# Patient Record
Sex: Male | Born: 1965 | Race: White | Hispanic: No | Marital: Married | Smoking: Current every day smoker
Health system: Southern US, Community
[De-identification: ages and names within clinical notes are randomized; demographics above are authoritative.]

## PROBLEM LIST (undated history)

## (undated) DIAGNOSIS — F250 Schizoaffective disorder, bipolar type: Secondary | ICD-10-CM

## (undated) DIAGNOSIS — I10 Essential (primary) hypertension: Secondary | ICD-10-CM

## (undated) DIAGNOSIS — E7800 Pure hypercholesterolemia, unspecified: Secondary | ICD-10-CM

## (undated) DIAGNOSIS — F259 Schizoaffective disorder, unspecified: Secondary | ICD-10-CM

---

## 2011-10-29 ENCOUNTER — Emergency Department (HOSPITAL_COMMUNITY)
Admission: EM | Admit: 2011-10-29 | Discharge: 2011-10-29 | Disposition: A | Discharge status: Home / Self Care | Payer: Medicare Other | Attending: Emergency Medicine | Admitting: Emergency Medicine

## 2011-10-29 ENCOUNTER — Emergency Department (HOSPITAL_COMMUNITY): Payer: Medicare Other

## 2011-10-29 ENCOUNTER — Other Ambulatory Visit: Payer: Self-pay

## 2011-10-29 ENCOUNTER — Encounter (HOSPITAL_COMMUNITY): Payer: Self-pay

## 2011-10-29 DIAGNOSIS — Z79899 Other long term (current) drug therapy: Secondary | ICD-10-CM | POA: Insufficient documentation

## 2011-10-29 DIAGNOSIS — E7800 Pure hypercholesterolemia, unspecified: Secondary | ICD-10-CM | POA: Insufficient documentation

## 2011-10-29 DIAGNOSIS — R5383 Other fatigue: Secondary | ICD-10-CM | POA: Insufficient documentation

## 2011-10-29 DIAGNOSIS — R5381 Other malaise: Secondary | ICD-10-CM | POA: Insufficient documentation

## 2011-10-29 DIAGNOSIS — Z8659 Personal history of other mental and behavioral disorders: Secondary | ICD-10-CM | POA: Insufficient documentation

## 2011-10-29 DIAGNOSIS — R531 Weakness: Secondary | ICD-10-CM

## 2011-10-29 DIAGNOSIS — I10 Essential (primary) hypertension: Secondary | ICD-10-CM | POA: Insufficient documentation

## 2011-10-29 HISTORY — DX: Schizoaffective disorder, bipolar type: F25.0

## 2011-10-29 HISTORY — DX: Essential (primary) hypertension: I10

## 2011-10-29 HISTORY — DX: Schizoaffective disorder, unspecified: F25.9

## 2011-10-29 HISTORY — DX: Pure hypercholesterolemia, unspecified: E78.00

## 2011-10-29 LAB — DIFFERENTIAL
Basophils Absolute: 0.1 10*3/uL (ref 0.0–0.1)
Eosinophils Relative: 3 % (ref 0–5)
Lymphocytes Relative: 24 % (ref 12–46)
Monocytes Absolute: 1.4 10*3/uL — ABNORMAL HIGH (ref 0.1–1.0)
Monocytes Relative: 10 % (ref 3–12)
Neutro Abs: 9.2 10*3/uL — ABNORMAL HIGH (ref 1.7–7.7)

## 2011-10-29 LAB — COMPREHENSIVE METABOLIC PANEL
BUN: 7 mg/dL (ref 6–23)
CO2: 26 mEq/L (ref 19–32)
Calcium: 9.6 mg/dL (ref 8.4–10.5)
Chloride: 100 mEq/L (ref 96–112)
Creatinine, Ser: 0.71 mg/dL (ref 0.50–1.35)
GFR calc Af Amer: >90 mL/min (ref >90)
GFR calc non Af Amer: >90 mL/min (ref >90)
Total Bilirubin: 0.2 mg/dL — ABNORMAL LOW (ref 0.3–1.2)

## 2011-10-29 LAB — CBC
HCT: 50.1 % (ref 39.0–52.0)
Hemoglobin: 17.5 g/dL — ABNORMAL HIGH (ref 13.0–17.0)
MCHC: 34.9 g/dL (ref 30.0–36.0)
MCV: 99.8 fL (ref 78.0–100.0)
RDW: 14.6 % (ref 11.5–15.5)
WBC: 14.5 10*3/uL — ABNORMAL HIGH (ref 4.0–10.5)

## 2011-10-29 LAB — TSH: TSH: 1.035 u[IU]/mL (ref 0.350–4.500)

## 2011-10-29 NOTE — ED Provider Notes (Signed)
History     CSN: [id number]  Arrival date & time 10/29/11  1428   First MD Initiated Contact with Patient 10/29/11 1536      Chief Complaint  Patient presents with  . Fatigue    (Consider location/radiation/quality/duration/timing/severity/associated sxs/prior treatment) Patient is a 46 y.o. male presenting with weakness. The history is provided by the patient.  Weakness Primary symptoms do not include fever. Primary symptoms comment: generalized weakness, fatigue Episode onset: four months ago. The symptoms are unchanged. The neurological symptoms are diffuse. Context: nothing.  Additional symptoms include weakness.    Past Medical History  Diagnosis Date  . High cholesterol   . Hypertension   . Schizophrenia, schizo-affective     History reviewed. No pertinent past surgical history.  History reviewed. No pertinent family history.  History  Substance Use Topics  . Smoking status: Current Everyday Smoker  . Smokeless tobacco: Not on file  . Alcohol Use: No      Review of Systems  Constitutional: Positive for activity change and fatigue. Negative for fever, chills, appetite change and unexpected weight change.  Neurological: Positive for weakness.  All other systems reviewed and are negative.    Allergies  Review of patient's allergies indicates no known allergies.  Home Medications   Current Outpatient Rx  Name Route Sig Dispense Refill  . MAGNESIUM 250 MG PO TABS Oral Take 1 tablet by mouth daily.    . OMEGA-3-ACID ETHYL ESTERS 1 G PO CAPS Oral Take 4 g by mouth daily.    Marland Kitchen ROSUVASTATIN CALCIUM 10 MG PO TABS Oral Take 10 mg by mouth at bedtime.    . TESTOSTERONE CYPIONATE 100 MG/ML IM OIL Intramuscular Inject 200 mg into the muscle every 28 (twenty-eight) days. For IM use only    . TRIHEXYPHENIDYL HCL 2 MG PO TABS Oral Take 2 mg by mouth at bedtime.    Marland Kitchen VITAMIN D (ERGOCALCIFEROL) 50000 UNITS PO CAPS Oral Take 50,000 Units by mouth every 7 (seven) days.  monday      BP 171/98  Pulse 106  Temp(Src) 98 F (36.7 C) (Oral)  Resp 20  SpO2 96%  Physical Exam  Nursing note and vitals reviewed. Constitutional: He is oriented to person, place, and time. He appears well-developed and well-nourished. No distress.  HENT:  Head: Normocephalic and atraumatic.  Eyes: EOM are normal. Pupils are equal, round, and reactive to light.  Neck: Normal range of motion. Neck supple.  Cardiovascular: Normal rate and regular rhythm.   No murmur heard. Pulmonary/Chest: Effort normal and breath sounds normal. No respiratory distress. He has no wheezes.  Abdominal: Soft. Bowel sounds are normal. He exhibits no distension. There is no tenderness.  Musculoskeletal: Normal range of motion. He exhibits no edema.  Lymphadenopathy:    He has no cervical adenopathy.  Neurological: He is alert and oriented to person, place, and time. No cranial nerve deficit.  Skin: Skin is warm and dry. He is not diaphoretic.    ED Course  Procedures (including critical care time)  Labs Reviewed  CBC - Abnormal; Notable for the following:    WBC 14.5 (*)    Hemoglobin 17.5 (*)    MCH 34.9 (*)    All other components within normal limits  DIFFERENTIAL - Abnormal; Notable for the following:    Neutro Abs 9.2 (*)    Monocytes Absolute 1.4 (*)    All other components within normal limits  COMPREHENSIVE METABOLIC PANEL - Abnormal; Notable for the following:  Total Bilirubin 0.2 (*)    All other components within normal limits  TSH   Dg Chest 2 View  10/29/2011  *RADIOLOGY REPORT*  Clinical Data: Fatigue,  4 months  CHEST - 2 VIEW  Comparison: None.  Findings: Normal mediastinum and heart silhouette.  Costophrenic angles are clear.  No effusion, infiltrate, pneumothorax.  IMPRESSION: No acute cardiopulmonary process.  Original Report Authenticated By: Genevive Bi, M.D.     No diagnosis found.   Date: 10/29/2011  Rate: 88  Rhythm: normal sinus rhythm  QRS Axis:  normal  Intervals: normal  ST/T Wave abnormalities: normal  Conduction Disutrbances:none  Narrative Interpretation:   Old EKG Reviewed: none available    MDM  The labs and ekg all look okay as does the chest xray.  The tsh is still pending.  I am unsure what is causing his chronic fatigue, but it does not appear emergent.  He will be discharged to home.  To follow up with pcp for further workup if symptoms do not resolve.        Geoffery Lyons, MD 10/29/11 1754

## 2011-10-29 NOTE — ED Notes (Signed)
Pt complains of feeling tired now for 4 months.

## 2011-10-29 NOTE — Discharge Instructions (Signed)

## 2014-02-17 ENCOUNTER — Emergency Department (INDEPENDENT_AMBULATORY_CARE_PROVIDER_SITE_OTHER)
Admission: EM | Admit: 2014-02-17 | Discharge: 2014-02-17 | Disposition: A | Discharge status: Home / Self Care | Payer: Medicare Other | Source: Home / Self Care

## 2014-02-17 ENCOUNTER — Encounter (HOSPITAL_COMMUNITY): Payer: Self-pay | Admitting: Emergency Medicine

## 2014-02-17 DIAGNOSIS — J440 Chronic obstructive pulmonary disease with acute lower respiratory infection: Secondary | ICD-10-CM

## 2014-02-17 DIAGNOSIS — J209 Acute bronchitis, unspecified: Secondary | ICD-10-CM

## 2014-02-17 DIAGNOSIS — Z23 Encounter for immunization: Secondary | ICD-10-CM

## 2014-02-17 MED ORDER — MINOCYCLINE HCL 100 MG PO CAPS: 100.0000 mg | ORAL_CAPSULE | Freq: Two times a day (BID) | ORAL | Status: DC

## 2014-02-17 MED ORDER — GUAIFENESIN-CODEINE 100-10 MG/5ML PO SYRP: 10.0000 mL | ORAL_SOLUTION | Freq: Four times a day (QID) | ORAL | Status: DC | PRN

## 2014-02-17 MED ORDER — TETANUS-DIPHTH-ACELL PERTUSSIS 5-2.5-18.5 LF-MCG/0.5 IM SUSP
0.5000 mL | Freq: Once | INTRAMUSCULAR | Status: AC
  Administered 2014-02-17: 0.5 mL via INTRAMUSCULAR

## 2014-02-17 MED ORDER — TETANUS-DIPHTH-ACELL PERTUSSIS 5-2.5-18.5 LF-MCG/0.5 IM SUSP
INTRAMUSCULAR | Status: AC
  Filled 2014-02-17: qty 0.5

## 2014-02-17 NOTE — ED Notes (Signed)
Patient requested tdap

## 2014-02-17 NOTE — ED Provider Notes (Signed)
CSN: [id number]     Arrival date & time 02/17/14  1738 History   None    No chief complaint on file.  (Consider location/radiation/quality/duration/timing/severity/associated sxs/prior Treatment) Patient is a 48 y.o. male presenting with cough.  Cough Cough characteristics:  Non-productive, dry and hacking Severity:  Moderate Onset quality:  Gradual Duration:  1 week Progression:  Unchanged Chronicity:  New Smoker: yes   Context: sick contacts   Relieved by:  None tried Worsened by:  Nothing tried Associated symptoms: rhinorrhea   Associated symptoms: no fever, no shortness of breath, no sore throat and no wheezing     Past Medical History  Diagnosis Date  . High cholesterol   . Hypertension   . Schizophrenia, schizo-affective    No past surgical history on file. No family history on file. History  Substance Use Topics  . Smoking status: Current Every Day Smoker  . Smokeless tobacco: Not on file  . Alcohol Use: No    Review of Systems  Constitutional: Negative.  Negative for fever.  HENT: Positive for congestion and rhinorrhea. Negative for sore throat.   Respiratory: Positive for cough. Negative for shortness of breath and wheezing.   Cardiovascular: Negative.   Gastrointestinal: Negative.     Allergies  Review of patient's allergies indicates no known allergies.  Home Medications   Prior to Admission medications   Medication Sig Start Date End Date Taking? Authorizing Provider  guaiFENesin-codeine (ROBITUSSIN AC) 100-10 MG/5ML syrup Take 10 mLs by mouth 4 (four) times daily as needed for cough. 02/17/14   Linna HoffJames D Rashid Whitenight, MD  Magnesium 250 MG TABS Take 1 tablet by mouth daily.    Historical Provider, MD  minocycline (MINOCIN,DYNACIN) 100 MG capsule Take 1 capsule (100 mg total) by mouth 2 (two) times daily. 02/17/14   Linna HoffJames D Yomar Mejorado, MD  omega-3 acid ethyl esters (LOVAZA) 1 G capsule Take 4 g by mouth daily.    Historical Provider, MD  rosuvastatin (CRESTOR) 10 MG  tablet Take 10 mg by mouth at bedtime.    Historical Provider, MD  testosterone cypionate (DEPOTESTOTERONE CYPIONATE) 100 MG/ML injection Inject 200 mg into the muscle every 28 (twenty-eight) days. For IM use only    Historical Provider, MD  trihexyphenidyl (ARTANE) 2 MG tablet Take 2 mg by mouth at bedtime.    Historical Provider, MD  Vitamin D, Ergocalciferol, (DRISDOL) 50000 UNITS CAPS Take 50,000 Units by mouth every 7 (seven) days. monday    Historical Provider, MD   BP 148/77  Pulse 90  Temp(Src) 98 F (36.7 C) (Oral)  SpO2 97% Physical Exam  Nursing note and vitals reviewed. Constitutional: He is oriented to person, place, and time. He appears well-developed and well-nourished. No distress.  HENT:  Right Ear: External ear normal.  Left Ear: External ear normal.  Mouth/Throat: Oropharynx is clear and moist.  Eyes: Pupils are equal, round, and reactive to light.  Neck: Normal range of motion. Neck supple.  Cardiovascular: Normal rate and normal heart sounds.   Pulmonary/Chest: Effort normal. No respiratory distress. He has no wheezes. He has rhonchi.  Abdominal: Soft. Bowel sounds are normal.  Neurological: He is alert and oriented to person, place, and time.  Skin: Skin is warm and dry.    ED Course  Procedures (including critical care time) Labs Review Labs Reviewed - No data to display  Imaging Review No results found.   MDM   1. Acute bronchitis with COPD       Linna HoffJames D Kim Lauver, MD  02/17/14 1841 

## 2014-02-17 NOTE — ED Notes (Signed)
Generalized achiness, sluggish, cough. Onset one week ago.  Other family members have been sick with similar symptoms

## 2014-02-17 NOTE — Discharge Instructions (Signed)
Take all of medicine, drink lots of fluids, no more smoking, see your doctor if further problems  °

## 2014-03-09 ENCOUNTER — Inpatient Hospital Stay (HOSPITAL_COMMUNITY)
Admission: EM | Admit: 2014-03-09 | Discharge: 2014-03-12 | DRG: 392 | Disposition: A | Discharge status: Home / Self Care | Payer: Medicare Other | Attending: Internal Medicine | Admitting: Internal Medicine

## 2014-03-09 ENCOUNTER — Emergency Department (HOSPITAL_COMMUNITY): Payer: Medicare Other

## 2014-03-09 ENCOUNTER — Encounter (HOSPITAL_COMMUNITY): Payer: Self-pay | Admitting: Emergency Medicine

## 2014-03-09 DIAGNOSIS — E876 Hypokalemia: Secondary | ICD-10-CM | POA: Diagnosis present

## 2014-03-09 DIAGNOSIS — Z720 Tobacco use: Secondary | ICD-10-CM | POA: Diagnosis present

## 2014-03-09 DIAGNOSIS — E7800 Pure hypercholesterolemia, unspecified: Secondary | ICD-10-CM | POA: Diagnosis present

## 2014-03-09 DIAGNOSIS — F17200 Nicotine dependence, unspecified, uncomplicated: Secondary | ICD-10-CM | POA: Diagnosis present

## 2014-03-09 DIAGNOSIS — D751 Secondary polycythemia: Secondary | ICD-10-CM | POA: Diagnosis present

## 2014-03-09 DIAGNOSIS — E860 Dehydration: Secondary | ICD-10-CM | POA: Diagnosis present

## 2014-03-09 DIAGNOSIS — K589 Irritable bowel syndrome without diarrhea: Secondary | ICD-10-CM | POA: Diagnosis present

## 2014-03-09 DIAGNOSIS — I517 Cardiomegaly: Secondary | ICD-10-CM | POA: Diagnosis present

## 2014-03-09 DIAGNOSIS — J449 Chronic obstructive pulmonary disease, unspecified: Secondary | ICD-10-CM | POA: Diagnosis present

## 2014-03-09 DIAGNOSIS — F259 Schizoaffective disorder, unspecified: Secondary | ICD-10-CM | POA: Diagnosis present

## 2014-03-09 DIAGNOSIS — R5381 Other malaise: Secondary | ICD-10-CM | POA: Diagnosis present

## 2014-03-09 DIAGNOSIS — K224 Dyskinesia of esophagus: Secondary | ICD-10-CM | POA: Diagnosis present

## 2014-03-09 DIAGNOSIS — E785 Hyperlipidemia, unspecified: Secondary | ICD-10-CM | POA: Diagnosis present

## 2014-03-09 DIAGNOSIS — Z91199 Patient's noncompliance with other medical treatment and regimen due to unspecified reason: Secondary | ICD-10-CM

## 2014-03-09 DIAGNOSIS — I10 Essential (primary) hypertension: Secondary | ICD-10-CM | POA: Diagnosis present

## 2014-03-09 DIAGNOSIS — Z9119 Patient's noncompliance with other medical treatment and regimen: Secondary | ICD-10-CM

## 2014-03-09 DIAGNOSIS — D7589 Other specified diseases of blood and blood-forming organs: Secondary | ICD-10-CM | POA: Diagnosis present

## 2014-03-09 DIAGNOSIS — R079 Chest pain, unspecified: Secondary | ICD-10-CM | POA: Diagnosis not present

## 2014-03-09 DIAGNOSIS — I2510 Atherosclerotic heart disease of native coronary artery without angina pectoris: Secondary | ICD-10-CM | POA: Diagnosis present

## 2014-03-09 DIAGNOSIS — K5900 Constipation, unspecified: Secondary | ICD-10-CM | POA: Diagnosis present

## 2014-03-09 DIAGNOSIS — D696 Thrombocytopenia, unspecified: Secondary | ICD-10-CM | POA: Diagnosis present

## 2014-03-09 DIAGNOSIS — R5383 Other fatigue: Secondary | ICD-10-CM

## 2014-03-09 DIAGNOSIS — E781 Pure hyperglyceridemia: Secondary | ICD-10-CM | POA: Diagnosis present

## 2014-03-09 DIAGNOSIS — J4489 Other specified chronic obstructive pulmonary disease: Secondary | ICD-10-CM | POA: Diagnosis present

## 2014-03-09 DIAGNOSIS — K219 Gastro-esophageal reflux disease without esophagitis: Principal | ICD-10-CM | POA: Diagnosis present

## 2014-03-09 DIAGNOSIS — D72829 Elevated white blood cell count, unspecified: Secondary | ICD-10-CM | POA: Diagnosis present

## 2014-03-09 DIAGNOSIS — R1310 Dysphagia, unspecified: Secondary | ICD-10-CM | POA: Diagnosis present

## 2014-03-09 LAB — CBC
HEMATOCRIT: 54.9 % — AB (ref 39.0–52.0)
HEMOGLOBIN: 19.9 g/dL — AB (ref 13.0–17.0)
MCH: 38.3 pg — AB (ref 26.0–34.0)
MCHC: 36.2 g/dL — AB (ref 30.0–36.0)
MCV: 105.6 fL — AB (ref 78.0–100.0)
Platelets: 166 10*3/uL (ref 150–400)
RBC: 5.2 MIL/uL (ref 4.22–5.81)
RDW: 14.5 % (ref 11.5–15.5)
WBC: 11.8 10*3/uL — ABNORMAL HIGH (ref 4.0–10.5)

## 2014-03-09 LAB — I-STAT TROPONIN, ED: Troponin i, poc: 0 ng/mL (ref 0.00–0.08)

## 2014-03-09 LAB — BASIC METABOLIC PANEL
Anion gap: 18 — ABNORMAL HIGH (ref 5–15)
BUN: 12 mg/dL (ref 6–23)
CALCIUM: 10 mg/dL (ref 8.4–10.5)
CO2: 25 mEq/L (ref 19–32)
Chloride: 95 mEq/L — ABNORMAL LOW (ref 96–112)
Creatinine, Ser: 0.79 mg/dL (ref 0.50–1.35)
GFR calc Af Amer: >90 mL/min (ref >90)
GLUCOSE: 122 mg/dL — AB (ref 70–99)
Potassium: 3.5 mEq/L — ABNORMAL LOW (ref 3.7–5.3)
Sodium: 138 mEq/L (ref 137–147)

## 2014-03-09 LAB — TROPONIN I: Troponin I: <0.3 ng/mL (ref <0.30)

## 2014-03-09 LAB — PRO B NATRIURETIC PEPTIDE: Pro B Natriuretic peptide (BNP): 61.1 pg/mL (ref 0–125)

## 2014-03-09 MED ORDER — ONDANSETRON HCL 4 MG PO TABS: 4.0000 mg | ORAL_TABLET | Freq: Four times a day (QID) | ORAL | Status: DC | PRN

## 2014-03-09 MED ORDER — CHLORTHALIDONE 25 MG PO TABS
25.0000 mg | ORAL_TABLET | Freq: Every day | ORAL | Status: DC
  Administered 2014-03-10 – 2014-03-12 (×2): 25 mg via ORAL
  Filled 2014-03-09 (×3): qty 1

## 2014-03-09 MED ORDER — TIOTROPIUM BROMIDE MONOHYDRATE 18 MCG IN CAPS
18.0000 ug | ORAL_CAPSULE | Freq: Every day | RESPIRATORY_TRACT | Status: DC
Start: 2014-03-09 — End: 2014-03-12
  Administered 2014-03-11: 18 ug via RESPIRATORY_TRACT
  Filled 2014-03-09: qty 5

## 2014-03-09 MED ORDER — ACETAMINOPHEN 325 MG PO TABS
650.0000 mg | ORAL_TABLET | Freq: Four times a day (QID) | ORAL | Status: DC | PRN
  Administered 2014-03-09: 650 mg via ORAL
  Filled 2014-03-09: qty 2

## 2014-03-09 MED ORDER — ASPIRIN EC 81 MG PO TBEC
81.0000 mg | DELAYED_RELEASE_TABLET | Freq: Every day | ORAL | Status: DC
  Administered 2014-03-10: 81 mg via ORAL
  Filled 2014-03-09 (×2): qty 1

## 2014-03-09 MED ORDER — IPRATROPIUM-ALBUTEROL 0.5-2.5 (3) MG/3ML IN SOLN: 3.0000 mL | RESPIRATORY_TRACT | Status: DC | PRN

## 2014-03-09 MED ORDER — CLONAZEPAM 0.5 MG PO TABS
0.5000 mg | ORAL_TABLET | Freq: Every day | ORAL | Status: DC
  Administered 2014-03-09 – 2014-03-11 (×3): 0.5 mg via ORAL
  Filled 2014-03-09 (×3): qty 1

## 2014-03-09 MED ORDER — NITROGLYCERIN 0.4 MG SL SUBL
0.4000 mg | SUBLINGUAL_TABLET | SUBLINGUAL | Status: AC | PRN
  Administered 2014-03-09 (×3): 0.4 mg via SUBLINGUAL

## 2014-03-09 MED ORDER — DOCUSATE SODIUM 100 MG PO CAPS
100.0000 mg | ORAL_CAPSULE | Freq: Every day | ORAL | Status: DC
  Administered 2014-03-10 – 2014-03-12 (×3): 100 mg via ORAL
  Filled 2014-03-09 (×3): qty 1

## 2014-03-09 MED ORDER — MORPHINE SULFATE 2 MG/ML IJ SOLN: 2.0000 mg | INTRAMUSCULAR | Status: DC | PRN

## 2014-03-09 MED ORDER — ASPIRIN 81 MG PO CHEW: 243.0000 mg | CHEWABLE_TABLET | Freq: Once | ORAL | Status: DC

## 2014-03-09 MED ORDER — NICOTINE 14 MG/24HR TD PT24
14.0000 mg | MEDICATED_PATCH | Freq: Every day | TRANSDERMAL | Status: DC
  Administered 2014-03-09: 14 mg via TRANSDERMAL
  Filled 2014-03-09 (×2): qty 1

## 2014-03-09 MED ORDER — MORPHINE SULFATE 4 MG/ML IJ SOLN
4.0000 mg | Freq: Once | INTRAMUSCULAR | Status: AC
  Administered 2014-03-09: 4 mg via INTRAVENOUS
  Filled 2014-03-09: qty 1

## 2014-03-09 MED ORDER — ONDANSETRON HCL 4 MG/2ML IJ SOLN: 4.0000 mg | Freq: Four times a day (QID) | INTRAMUSCULAR | Status: DC | PRN

## 2014-03-09 MED ORDER — ACETAMINOPHEN 650 MG RE SUPP: 650.0000 mg | Freq: Four times a day (QID) | RECTAL | Status: DC | PRN

## 2014-03-09 MED ORDER — TRIHEXYPHENIDYL HCL 2 MG PO TABS
2.0000 mg | ORAL_TABLET | Freq: Every day | ORAL | Status: DC
  Administered 2014-03-09 – 2014-03-11 (×3): 2 mg via ORAL
  Filled 2014-03-09 (×4): qty 1

## 2014-03-09 MED ORDER — MINOCYCLINE HCL 100 MG PO CAPS
100.0000 mg | ORAL_CAPSULE | Freq: Two times a day (BID) | ORAL | Status: DC
  Administered 2014-03-09 – 2014-03-12 (×6): 100 mg via ORAL
  Filled 2014-03-09 (×7): qty 1

## 2014-03-09 MED ORDER — NAPROXEN 500 MG PO TABS
500.0000 mg | ORAL_TABLET | Freq: Two times a day (BID) | ORAL | Status: DC
  Administered 2014-03-10: 500 mg via ORAL
  Filled 2014-03-09 (×3): qty 1

## 2014-03-09 MED ORDER — SODIUM CHLORIDE 0.9 % IV BOLUS (SEPSIS)
1000.0000 mL | Freq: Once | INTRAVENOUS | Status: AC
  Administered 2014-03-09: 1000 mL via INTRAVENOUS

## 2014-03-09 MED ORDER — NITROGLYCERIN 0.4 MG SL SUBL: 0.4000 mg | SUBLINGUAL_TABLET | SUBLINGUAL | Status: DC | PRN

## 2014-03-09 MED ORDER — IPRATROPIUM BROMIDE 0.02 % IN SOLN
0.5000 mg | Freq: Once | RESPIRATORY_TRACT | Status: AC
  Administered 2014-03-09: 0.5 mg via RESPIRATORY_TRACT
  Filled 2014-03-09: qty 2.5

## 2014-03-09 MED ORDER — ATORVASTATIN CALCIUM 10 MG PO TABS
10.0000 mg | ORAL_TABLET | Freq: Every day | ORAL | Status: DC
  Administered 2014-03-10 – 2014-03-11 (×2): 10 mg via ORAL
  Filled 2014-03-09 (×3): qty 1

## 2014-03-09 MED ORDER — GABAPENTIN 400 MG PO CAPS
1200.0000 mg | ORAL_CAPSULE | Freq: Every day | ORAL | Status: DC
  Administered 2014-03-10 – 2014-03-12 (×3): 1200 mg via ORAL
  Filled 2014-03-09 (×3): qty 3

## 2014-03-09 MED ORDER — ATENOLOL 50 MG PO TABS
50.0000 mg | ORAL_TABLET | Freq: Every day | ORAL | Status: DC
  Administered 2014-03-10 – 2014-03-12 (×3): 50 mg via ORAL
  Filled 2014-03-09 (×3): qty 1

## 2014-03-09 MED ORDER — ALBUTEROL SULFATE (2.5 MG/3ML) 0.083% IN NEBU
5.0000 mg | INHALATION_SOLUTION | Freq: Once | RESPIRATORY_TRACT | Status: AC
  Administered 2014-03-09: 5 mg via RESPIRATORY_TRACT
  Filled 2014-03-09: qty 6

## 2014-03-09 MED ORDER — ATENOLOL-CHLORTHALIDONE 50-25 MG PO TABS: 1.0000 | ORAL_TABLET | Freq: Every day | ORAL | Status: DC

## 2014-03-09 MED ORDER — HEPARIN SODIUM (PORCINE) 5000 UNIT/ML IJ SOLN
5000.0000 [IU] | Freq: Three times a day (TID) | INTRAMUSCULAR | Status: DC
  Administered 2014-03-09 – 2014-03-11 (×6): 5000 [IU] via SUBCUTANEOUS
  Filled 2014-03-09 (×11): qty 1

## 2014-03-09 NOTE — ED Notes (Signed)
Pt reports sudden onset of 6/10 non-radiating central chest pain described as "sharp" with diaphoresis, nausea, SOB, dizziness and fatigue starting at 10 AM today. Pt states he has been feeling fatigued x 2 weeks. Pt also requesting multiple times "I really want to check to see if I am going through menopause." Pt speaks in complete sentences, NAD. AO x4.

## 2014-03-09 NOTE — H&P (Signed)
[facility] History and Physical  Cordell Guercio ZOX:[id number] DOB: August 29, 1965 DOA: 03/09/2014  Referring physician: Emergency department PCP: Pcp Not In System  Specialists:   Chief Complaint: Chest pain  HPI: [name] [name] is a 48 y.o. male  With a hx of hld, htn, who presents with one week of constant chest pain. Reports diaphoresis w/ chest pain. On further questioning, pt reports food getting stuck in throat, causing cp. Pt also reports sob and general weakness over the past 2 weeks. Noncompliant with home meds. In ED, pt found to have unremarkable ekg. Initial trop was neg. Hospitalist service consulted for consideration for admission.  Review of Systems:  Per above, the remainder of the 10pt ros reviewed and are neg  Past Medical History  Diagnosis Date  . High cholesterol   . Hypertension   . Schizophrenia, schizo-affective    No past surgical history on file. Social History:  reports that he has been smoking Cigarettes.  He has a 4 pack-year smoking history. He does not have any smokeless tobacco history on file. He reports that he does not drink alcohol or use illicit drugs.  where does patient live--home, ALF, SNF? and with whom if at home?  Can patient participate in ADLs?  No Known Allergies  No family history on file. reviewed and is noncontributaory and is neg (be sure to complete)  Prior to Admission medications   Medication Sig Start Date End Date Taking? Authorizing Provider  aspirin EC 81 MG tablet Take 81 mg by mouth daily.   Yes Historical Provider, MD  atenolol-chlorthalidone (TENORETIC) 50-25 MG per tablet Take 1 tablet by mouth daily.   Yes Historical Provider, MD  Cholecalciferol (VITAMIN D-3) 5000 UNITS TABS Take 5,000 Units by mouth 2 (two) times daily.   Yes Historical Provider, MD  clonazePAM (KLONOPIN) 0.5 MG tablet Take 0.5 mg by mouth at bedtime.    Yes Historical Provider, MD  docusate sodium (COLACE) 100 MG capsule Take 100 mg by mouth daily.    Yes Historical Provider, MD  gabapentin (NEURONTIN) 400 MG capsule Take 1,200 mg by mouth daily.    Yes Historical Provider, MD  guaiFENesin-codeine (ROBITUSSIN AC) 100-10 MG/5ML syrup Take 10 mLs by mouth 4 (four) times daily as needed for cough. 02/17/14  Yes Linna Hoff, MD  haloperidol lactate (HALDOL) 5 MG/ML injection Inject 2.5 mg into the muscle every 28 (twenty-eight) days.   Yes Historical Provider, MD  Magnesium 250 MG TABS Take 250 mg by mouth daily.    Yes Historical Provider, MD  naproxen (NAPROSYN) 500 MG tablet Take 500 mg by mouth 2 (two) times daily with a meal.   Yes Historical Provider, MD  omega-3 acid ethyl esters (LOVAZA) 1 G capsule Take 4 g by mouth daily.   Yes Historical Provider, MD  rosuvastatin (CRESTOR) 10 MG tablet Take 10 mg by mouth at bedtime.   Yes Historical Provider, MD  tiotropium (SPIRIVA) 18 MCG inhalation capsule Place 18 mcg into inhaler and inhale daily.   Yes Historical Provider, MD  trihexyphenidyl (ARTANE) 2 MG tablet Take 2 mg by mouth at bedtime.   Yes Historical Provider, MD  minocycline (MINOCIN,DYNACIN) 100 MG capsule Take 1 capsule (100 mg total) by mouth 2 (two) times daily. 02/17/14   Linna Hoff, MD   Physical Exam: Filed Vitals:   03/09/14 1402 03/09/14 1608  BP: 139/83 133/88  Pulse: 54 79  Temp: 97.8 F (36.6 C) 98.3 F (36.8 C)  TempSrc: Oral Oral  Resp: 18 16  SpO2: 97%      General:  Awake, in nad  Eyes: PERRL B  ENT: membranes moist, dentition fair  Neck: trachea midline, neck supple  Cardiovascular: regular, s1, s2  Respiratory: normal resp effort, no wheezing  Abdomen: soft, nondistended  Skin: normal skin turgor, no abnormal skin lesions seen  Musculoskeletal: perfused, no clubbing  Psychiatric: mood/affect normal// no auditory/visual hallucinations  Neurologic: cn2-12 grossly intact, strength/sensation intact  Labs on Admission:  Basic Metabolic Panel:  Recent Labs Lab 03/09/14 1430  NA 138  K  3.5*  CL 95*  CO2 25  GLUCOSE 122*  BUN 12  CREATININE 0.79  CALCIUM 10.0   Liver Function Tests: No results found for this basename: AST, ALT, ALKPHOS, BILITOT, PROT, ALBUMIN,  in the last 168 hours No results found for this basename: LIPASE, AMYLASE,  in the last 168 hours No results found for this basename: AMMONIA,  in the last 168 hours CBC:  Recent Labs Lab 03/09/14 1430  WBC 11.8*  HGB 19.9*  HCT 54.9*  MCV 105.6*  PLT 166   Cardiac Enzymes: No results found for this basename: CKTOTAL, CKMB, CKMBINDEX, TROPONINI,  in the last 168 hours  BNP (last 3 results)  Recent Labs  03/09/14 1407  PROBNP 61.1   CBG: No results found for this basename: GLUCAP,  in the last 168 hours  Radiological Exams on Admission: Dg Chest 2 View  03/09/2014   CLINICAL DATA:  Chest pain and fatigue  EXAM: CHEST  2 VIEW  COMPARISON:  October 29, 2011  FINDINGS: There is no edema or consolidation. Heart size and pulmonary vascularity are within normal limits. No adenopathy. No pneumothorax. No bone lesions.  IMPRESSION: No edema or consolidation.   Electronically Signed   By: Bretta BangWilliam  Woodruff M.D.   On: 03/09/2014 15:14    EKG: Independently reviewed. NSR  Assessment/Plan Principal Problem:   Chest pain Active Problems:   HLD (hyperlipidemia)   HTN (hypertension)   1. Chest pain 1. Follow serial enzymes 2. Repeat EKG in AM 3. Will check 2d echo for WMA 4. Cont with morphine /ntg for sx relief 5. Per above, pt noted food "getting stuck in my throat" associated with some chest pain 6. Will request barium swallow 7. Admit to med-tele 2. HTN 1. BP stable 2. Cont home regimen 3. HLD 1. Cont statin  2. Will check fasting lipid 4. Tobacco abuse 1. Pt desires nicotine patch 2. 10min cessation done face to face 5. DVT prophylaxis 1. Heparin subQ  Code Status: Full (must indicate code status--if unknown or must be presumed, indicate so) Family Communication: Pt and family in  room (indicate person spoken with, if applicable, with phone number if by telephone) Disposition Plan: Pending (indicate anticipated LOS)  Time spent: 30min  Donovyn Guidice K [facility] Pager [phone]  If 7PM-7AM, please contact night-coverage [email or web address] Password [email or web address] 03/09/2014, 6:00 PM

## 2014-03-09 NOTE — ED Notes (Signed)
Pt states chest pain is gone after 3 nitroglycerin tablets. Pt c/o slight headache at this time.

## 2014-03-09 NOTE — ED Provider Notes (Signed)
CSN: [id number]     Arrival date & time 03/09/14  1355 History   First MD Initiated Contact with Patient 03/09/14 1645     Chief Complaint  Patient presents with  . Chest Pain     (Consider location/radiation/quality/duration/timing/severity/associated sxs/prior Treatment) HPI Comments: Patient presents to the ED with a chief complaint of chest pain.  He states that the symptoms started this morning at 10:00.  He states the symptoms are in the middle of his chest.  It does not radiate.  He reports associated exertional SOB and worsening chest pain.  He does not have any cardiac history.  Cardiac risk factors include HTN, HL, smoking, and age.  He has taken a baby aspirin this morning.  He also reports a history of COPD and chronic bronchitis.  He also complains of fatigue for the past 2 weeks.  Additionally, patient asks if he can be tested for menopause.  The history is provided by the patient. No language interpreter was used.    Past Medical History  Diagnosis Date  . High cholesterol   . Hypertension   . Schizophrenia, schizo-affective    No past surgical history on file. No family history on file. History  Substance Use Topics  . Smoking status: Current Every Day Smoker -- 1.00 packs/day for 4 years    Types: Cigarettes  . Smokeless tobacco: Not on file  . Alcohol Use: No    Review of Systems  Respiratory: Positive for shortness of breath.   Cardiovascular: Positive for chest pain.  All other systems reviewed and are negative.     Allergies  Review of patient's allergies indicates no known allergies.  Home Medications   Prior to Admission medications   Medication Sig Start Date End Date Taking? Authorizing Provider  atenolol-chlorthalidone (TENORETIC) 50-25 MG per tablet Take 1 tablet by mouth daily.    Historical Provider, MD  clonazePAM (KLONOPIN) 0.5 MG tablet Take 0.5 mg by mouth 2 (two) times daily as needed for anxiety.    Historical Provider, MD   gabapentin (NEURONTIN) 400 MG capsule Take 400 mg by mouth 3 (three) times daily.    Historical Provider, MD  guaiFENesin-codeine (ROBITUSSIN AC) 100-10 MG/5ML syrup Take 10 mLs by mouth 4 (four) times daily as needed for cough. 02/17/14   Linna Hoff, MD  Magnesium 250 MG TABS Take 1 tablet by mouth daily.    Historical Provider, MD  minocycline (MINOCIN,DYNACIN) 100 MG capsule Take 1 capsule (100 mg total) by mouth 2 (two) times daily. 02/17/14   Linna Hoff, MD  omega-3 acid ethyl esters (LOVAZA) 1 G capsule Take 4 g by mouth daily.    Historical Provider, MD  rosuvastatin (CRESTOR) 10 MG tablet Take 10 mg by mouth at bedtime.    Historical Provider, MD  testosterone cypionate (DEPOTESTOTERONE CYPIONATE) 100 MG/ML injection Inject 200 mg into the muscle every 28 (twenty-eight) days. For IM use only    Historical Provider, MD  tiotropium (SPIRIVA) 18 MCG inhalation capsule Place 18 mcg into inhaler and inhale daily.    Historical Provider, MD  trihexyphenidyl (ARTANE) 2 MG tablet Take 2 mg by mouth at bedtime.    Historical Provider, MD  Vitamin D, Ergocalciferol, (DRISDOL) 50000 UNITS CAPS Take 50,000 Units by mouth every 7 (seven) days. monday    Historical Provider, MD   BP 133/88  Pulse 79  Temp(Src) 98.3 F (36.8 C) (Oral)  Resp 16  SpO2 97% Physical Exam  Nursing note and vitals reviewed.  Constitutional: He is oriented to person, place, and time. He appears well-developed and well-nourished.  HENT:  Head: Normocephalic and atraumatic.  Eyes: Conjunctivae and EOM are normal. Pupils are equal, round, and reactive to light. Right eye exhibits no discharge. Left eye exhibits no discharge. No scleral icterus.  Neck: Normal range of motion. Neck supple. No JVD present.  Cardiovascular: Normal rate, regular rhythm and normal heart sounds.  Exam reveals no gallop and no friction rub.   No murmur heard. Pulmonary/Chest: Effort normal and breath sounds normal. No respiratory distress. He  has no wheezes. He has no rales. He exhibits no tenderness.  Abdominal: Soft. He exhibits no distension and no mass. There is no tenderness. There is no rebound and no guarding.  Musculoskeletal: Normal range of motion. He exhibits no edema and no tenderness.  Neurological: He is alert and oriented to person, place, and time.  Skin: Skin is warm and dry.  Psychiatric: He has a normal mood and affect. His behavior is normal. Judgment and thought content normal.    ED Course  Procedures (including critical care time) Results for orders placed during the hospital encounter of 03/09/14  CBC      Result Value Ref Range   WBC 11.8 (*) 4.0 - 10.5 K/uL   RBC 5.20  4.22 - 5.81 MIL/uL   Hemoglobin 19.9 (*) 13.0 - 17.0 g/dL   HCT 16.1 (*) 09.6 - 04.5 %   MCV 105.6 (*) 78.0 - 100.0 fL   MCH 38.3 (*) 26.0 - 34.0 pg   MCHC 36.2 (*) 30.0 - 36.0 g/dL   RDW 40.9  81.1 - 91.4 %   Platelets 166  150 - 400 K/uL  BASIC METABOLIC PANEL      Result Value Ref Range   Sodium 138  137 - 147 mEq/L   Potassium 3.5 (*) 3.7 - 5.3 mEq/L   Chloride 95 (*) 96 - 112 mEq/L   CO2 25  19 - 32 mEq/L   Glucose, Bld 122 (*) 70 - 99 mg/dL   BUN 12  6 - 23 mg/dL   Creatinine, Ser 7.82  0.50 - 1.35 mg/dL   Calcium 95.6  8.4 - 21.3 mg/dL   GFR calc non Af Amer >90  >90 mL/min   GFR calc Af Amer >90  >90 mL/min   Anion gap 18 (*) 5 - 15  PRO B NATRIURETIC PEPTIDE      Result Value Ref Range   Pro B Natriuretic peptide (BNP) 61.1  0 - 125 pg/mL  I-STAT TROPOININ, ED      Result Value Ref Range   Troponin i, poc 0.00  0.00 - 0.08 ng/mL   Comment 3            Dg Chest 2 View  03/09/2014   CLINICAL DATA:  Chest pain and fatigue  EXAM: CHEST  2 VIEW  COMPARISON:  October 29, 2011  FINDINGS: There is no edema or consolidation. Heart size and pulmonary vascularity are within normal limits. No adenopathy. No pneumothorax. No bone lesions.  IMPRESSION: No edema or consolidation.   Electronically Signed   By: Bretta Bang  M.D.   On: 03/09/2014 15:14     Imaging Review Dg Chest 2 View  03/09/2014   CLINICAL DATA:  Chest pain and fatigue  EXAM: CHEST  2 VIEW  COMPARISON:  October 29, 2011  FINDINGS: There is no edema or consolidation. Heart size and pulmonary vascularity are within normal limits. No adenopathy.  No pneumothorax. No bone lesions.  IMPRESSION: No edema or consolidation.   Electronically Signed   By: Bretta BangWilliam  Woodruff M.D.   On: 03/09/2014 15:14     EKG Interpretation   Date/Time:  Sunday March 09 2014 14:00:16 EDT Ventricular Rate:  85 PR Interval:  148 QRS Duration: 82 QT Interval:  358 QTC Calculation: 426 R Axis:   31 Text Interpretation:  Normal sinus rhythm Possible Left atrial enlargement  Borderline ECG Similar to prior Confirmed by Gwendolyn GrantWALDEN  MD, BLAIR ([id number]) on  03/09/2014 4:46:01 PM      MDM   Final diagnoses:  Chest pain, unspecified chest pain type   Patient with chest pain.  Labs are remarkable for mild leukocytosis, he also appears to be heme concentrated as his HGB is 19.9.  I suspect that dehydration could be part of the reason the patient has been feeling more tired.  PERC negative, highly doubt PE.  The pain does sound anginal in nature.  HEART score is 4.  Discussed the patient with Dr. Gwendolyn GrantWalden.  Will plan for admission.   Roxy Horsemanobert Kaydyn Sayas, PA-C 03/09/14 1805

## 2014-03-10 ENCOUNTER — Encounter (HOSPITAL_COMMUNITY): Payer: Self-pay | Admitting: Cardiology

## 2014-03-10 ENCOUNTER — Observation Stay (HOSPITAL_COMMUNITY): Payer: Medicare Other

## 2014-03-10 DIAGNOSIS — R079 Chest pain, unspecified: Secondary | ICD-10-CM

## 2014-03-10 DIAGNOSIS — Z720 Tobacco use: Secondary | ICD-10-CM | POA: Diagnosis present

## 2014-03-10 DIAGNOSIS — R1310 Dysphagia, unspecified: Secondary | ICD-10-CM | POA: Diagnosis present

## 2014-03-10 DIAGNOSIS — E876 Hypokalemia: Secondary | ICD-10-CM | POA: Diagnosis present

## 2014-03-10 DIAGNOSIS — E785 Hyperlipidemia, unspecified: Secondary | ICD-10-CM

## 2014-03-10 DIAGNOSIS — I519 Heart disease, unspecified: Secondary | ICD-10-CM

## 2014-03-10 LAB — CBC
HEMATOCRIT: 49.4 % (ref 39.0–52.0)
HEMOGLOBIN: 17.3 g/dL — AB (ref 13.0–17.0)
MCH: 36.7 pg — ABNORMAL HIGH (ref 26.0–34.0)
MCHC: 35 g/dL (ref 30.0–36.0)
MCV: 104.9 fL — ABNORMAL HIGH (ref 78.0–100.0)
Platelets: 149 10*3/uL — ABNORMAL LOW (ref 150–400)
RBC: 4.71 MIL/uL (ref 4.22–5.81)
RDW: 14.6 % (ref 11.5–15.5)
WBC: 13.1 10*3/uL — AB (ref 4.0–10.5)

## 2014-03-10 LAB — LIPID PANEL
Cholesterol: 174 mg/dL (ref 0–200)
HDL: 18 mg/dL — ABNORMAL LOW (ref >39)
LDL CALC: UNDETERMINED mg/dL (ref 0–99)
Total CHOL/HDL Ratio: 9.7 RATIO
Triglycerides: 1020 mg/dL — ABNORMAL HIGH (ref <150)
VLDL: UNDETERMINED mg/dL (ref 0–40)

## 2014-03-10 LAB — COMPREHENSIVE METABOLIC PANEL
ALBUMIN: 3.4 g/dL — AB (ref 3.5–5.2)
ALT: 41 U/L (ref 0–53)
ANION GAP: 17 — AB (ref 5–15)
AST: 25 U/L (ref 0–37)
Alkaline Phosphatase: 73 U/L (ref 39–117)
BUN: 13 mg/dL (ref 6–23)
CO2: 25 mEq/L (ref 19–32)
CREATININE: 0.83 mg/dL (ref 0.50–1.35)
Calcium: 9.1 mg/dL (ref 8.4–10.5)
Chloride: 96 mEq/L (ref 96–112)
GFR calc Af Amer: >90 mL/min (ref >90)
GFR calc non Af Amer: >90 mL/min (ref >90)
Glucose, Bld: 92 mg/dL (ref 70–99)
Potassium: 3.4 mEq/L — ABNORMAL LOW (ref 3.7–5.3)
Sodium: 138 mEq/L (ref 137–147)
TOTAL PROTEIN: 6.5 g/dL (ref 6.0–8.3)
Total Bilirubin: 0.3 mg/dL (ref 0.3–1.2)

## 2014-03-10 LAB — RETICULOCYTES
RBC.: 4.7 MIL/uL (ref 4.22–5.81)
RETIC COUNT ABSOLUTE: 94 10*3/uL (ref 19.0–186.0)
Retic Ct Pct: 2 % (ref 0.4–3.1)

## 2014-03-10 LAB — TROPONIN I
Troponin I: <0.3 ng/mL (ref <0.30)
Troponin I: <0.3 ng/mL (ref <0.30)

## 2014-03-10 LAB — HEMOGLOBIN A1C
HEMOGLOBIN A1C: 6.1 % — AB (ref <5.7)
Mean Plasma Glucose: 128 mg/dL — ABNORMAL HIGH (ref <117)

## 2014-03-10 MED ORDER — POTASSIUM CHLORIDE CRYS ER 20 MEQ PO TBCR
40.0000 meq | EXTENDED_RELEASE_TABLET | Freq: Once | ORAL | Status: AC
  Administered 2014-03-10: 40 meq via ORAL
  Filled 2014-03-10: qty 2

## 2014-03-10 MED ORDER — TECHNETIUM TC 99M SESTAMIBI GENERIC - CARDIOLITE
30.0000 | Freq: Once | INTRAVENOUS | Status: AC | PRN
  Administered 2014-03-10: 30 via INTRAVENOUS

## 2014-03-10 MED ORDER — PANTOPRAZOLE SODIUM 40 MG PO TBEC
40.0000 mg | DELAYED_RELEASE_TABLET | Freq: Every day | ORAL | Status: DC
  Administered 2014-03-10 – 2014-03-12 (×3): 40 mg via ORAL
  Filled 2014-03-10 (×3): qty 1

## 2014-03-10 MED ORDER — ASPIRIN 81 MG PO CHEW
324.0000 mg | CHEWABLE_TABLET | ORAL | Status: AC
  Administered 2014-03-11: 324 mg via ORAL
  Filled 2014-03-10: qty 4

## 2014-03-10 MED ORDER — SODIUM CHLORIDE 0.9 % IV SOLN
1.0000 mL/kg/h | INTRAVENOUS | Status: DC
  Administered 2014-03-11: 1 mL/kg/h via INTRAVENOUS

## 2014-03-10 MED ORDER — TECHNETIUM TC 99M SESTAMIBI GENERIC - CARDIOLITE
10.0000 | Freq: Once | INTRAVENOUS | Status: AC | PRN
  Administered 2014-03-10: 10 via INTRAVENOUS

## 2014-03-10 MED ORDER — SODIUM CHLORIDE 0.9 % IV SOLN: 250.0000 mL | INTRAVENOUS | Status: DC | PRN

## 2014-03-10 MED ORDER — NICOTINE 21 MG/24HR TD PT24
21.0000 mg | MEDICATED_PATCH | Freq: Every day | TRANSDERMAL | Status: DC
Start: 2014-03-10 — End: 2014-03-12
  Administered 2014-03-10 – 2014-03-12 (×3): 21 mg via TRANSDERMAL
  Filled 2014-03-10 (×3): qty 1

## 2014-03-10 MED ORDER — FENOFIBRATE 54 MG PO TABS
54.0000 mg | ORAL_TABLET | Freq: Every day | ORAL | Status: DC
  Administered 2014-03-10 – 2014-03-12 (×3): 54 mg via ORAL
  Filled 2014-03-10 (×4): qty 1

## 2014-03-10 MED ORDER — SODIUM CHLORIDE 0.9 % IJ SOLN
3.0000 mL | Freq: Two times a day (BID) | INTRAMUSCULAR | Status: DC
  Administered 2014-03-10 – 2014-03-11 (×2): 3 mL via INTRAVENOUS

## 2014-03-10 MED ORDER — SODIUM CHLORIDE 0.9 % IJ SOLN: 3.0000 mL | INTRAMUSCULAR | Status: DC | PRN

## 2014-03-10 NOTE — Progress Notes (Signed)
PROGRESS NOTE    [name] [name] AVW:[id number] DOB: 1966/04/05 DOA: 03/09/2014 PCP: Massie Maroon, FNP Psychiatry: Vesta Mixer  HPI/Brief narrative 48 year old male patient with history of dyslipidemia, hypertension, schizoaffective/schizophrenia, ongoing tobacco abuse, presented to the ED on 03/09/14 with one week history of intermittent midsternal exertional chest pains with associated diaphoresis. He also gives a 3-4 month history of painful swallowing and sensation of food getting stuck in his food passage. He was recently started on "sleeping pills". He gives 2 months history of generalized weakness and malaise. In the ED, EKG was nonischemic, troponins were negative. Cardiology was consulted and his nuclear stress test returned positive.     Assessment/Plan:  1. Chest pain: EKG nonischemic. Troponins x3: Negative. Chest x-ray negative. Cardiology was consulted and performed stress Myoview which was positive. Discussed with cardiology who plan cardiac cath 7/28. 2. Dysphagia/odynophagia: Barium swallow shows mild esophageal dysmotility. Will add PPI. GI consulted-? EGD. He has had this problem for 3-4 months and unfortunately his appointment to see his PCP is still a month away for him to be referred to GI as outpatient. 3. Dyslipidemia: Marked hypertriglyceridemia. Continue Crestor. Add TriCor. Will need close followup as outpatient. 4. Tobacco abuse: Cessation counseled. Requests nicotine patch. 5. Schizophrenia/schizoaffective disorder:? Psych meds causing some of his malaise and generalized weakness. Outpatient followup with psychiatry 6. Hypokalemia: Replace and follow BMP 7. Elevated Hb/?polycythemia:? Secondary to tobacco abuse. Has macrocytosis. Mild thrombocytopenia and mild leukocytosis. Will check anemia panel. Has had elevated hemoglobin up to back in March 2013. 8. Hypertension: Controlled: Continue atenolol. Continue diuretics. Dysphagia   Code Status: Full Family  Communication: Discussed with spouse at bedside. Disposition Plan: Home when medically stable.   Consultants:  Cardiology  Gastroenterology  Procedures:  None  Antibiotics:  None   Subjective: States that chest pain has resolved since sometime last night.  Objective: Filed Vitals:   03/10/14 1114 03/10/14 1116 03/10/14 1118 03/10/14 1329  BP: 146/74 133/76 122/76 135/79  Pulse:    92  Temp:    97.9 F (36.6 C)  TempSrc:    Oral  Resp:    15  Height:      Weight:      SpO2:    94%    Intake/Output Summary (Last 24 hours) at 03/10/14 1700 Last data filed at 03/10/14 1659  Gross per 24 hour  Intake    360 ml  Output   1350 ml  Net   -990 ml   Filed Weights   03/09/14 1942  Weight: 84.233 kg (185 lb 11.2 oz)     Exam:  General exam: Pleasant young male, sitting up comfortably in. Flat affect. Respiratory system: Clear. No increased work of breathing. Cardiovascular system: S1 & S2 heard, RRR. No JVD, murmurs, gallops, clicks or pedal edema. Telemetry: Sinus rhythm. Gastrointestinal system: Abdomen is nondistended, soft and nontender. Normal bowel sounds heard. Central nervous system: Alert and oriented. No focal neurological deficits. Extremities: Symmetric 5 x 5 power.   Data Reviewed: Basic Metabolic Panel:  Recent Labs Lab 03/09/14 1430 03/10/14 0145  NA 138 138  K 3.5* 3.4*  CL 95* 96  CO2 25 25  GLUCOSE 122* 92  BUN 12 13  CREATININE 0.79 0.83  CALCIUM 10.0 9.1   Liver Function Tests:  Recent Labs Lab 03/10/14 0145  AST 25  ALT 41  ALKPHOS 73  BILITOT 0.3  PROT 6.5  ALBUMIN 3.4*   No results found for this basename: LIPASE, AMYLASE,  in the last 168  hours No results found for this basename: AMMONIA,  in the last 168 hours CBC:  Recent Labs Lab 03/09/14 1430 03/10/14 0145  WBC 11.8* 13.1*  HGB 19.9* 17.3*  HCT 54.9* 49.4  MCV 105.6* 104.9*  PLT 166 149*   Cardiac Enzymes:  Recent Labs Lab 03/09/14 2027  03/10/14 0145 03/10/14 0825  TROPONINI <0.30 <0.30 <0.30   BNP (last 3 results)  Recent Labs  03/09/14 1407  PROBNP 61.1   CBG: No results found for this basename: GLUCAP,  in the last 168 hours  No results found for this or any previous visit (from the past 240 hour(s)).    Additional labs: 1. Fasting lipids: Cholesterol 174, triglycerides 1020, HDL 18. Unable to calculate LDL and VLDL. 2. Hemoglobin A1c: 6.1 3.      Studies: Dg Chest 2 View  03/09/2014   CLINICAL DATA:  Chest pain and fatigue  EXAM: CHEST  2 VIEW  COMPARISON:  October 29, 2011  FINDINGS: There is no edema or consolidation. Heart size and pulmonary vascularity are within normal limits. No adenopathy. No pneumothorax. No bone lesions.  IMPRESSION: No edema or consolidation.   Electronically Signed   By: Bretta Bang M.D.   On: 03/09/2014 15:14   Dg Esophagus  03/10/2014   CLINICAL DATA:  Upper esophageal dysphagia with solids.  EXAM: ESOPHOGRAM / BARIUM SWALLOW / BARIUM TABLET STUDY  TECHNIQUE: Combined double contrast and single contrast examination performed using effervescent crystals, thick barium liquid, and thin barium liquid. The patient was observed with fluoroscopy swallowing a 13mm barium sulphate tablet.  FLUOROSCOPY TIME:  2 min and 41 seconds  COMPARISON:  Chest radiograph of 03/09/2014  FINDINGS: Hypopharyngeal portion of the exam is unremarkable.  Double contrast evaluation of the esophagus demonstrates no mucosal abnormality.  Evaluation of primary peristalsis demonstrates occasional esophageal dysmotility with contrast stasis in the mid and lower thoracic esophagus. This is apparent only on some swallows.  Full column evaluation of the esophagus demonstrates no persistent narrowing or stricture.  Mildly delayed passage of a 13 mm barium tablet at the lower esophagus.  IMPRESSION: 1. 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 2. Delayed passage of a 13 mm barium tablet at the lower  esophagus, likely secondary.   Electronically Signed   By: Jeronimo Greaves M.D.   On: 03/10/2014 13:33   Nm Myocar Multi W/spect W/wall Motion / Ef  03/10/2014   CLINICAL DATA:  Chest pain. Hypercholesterolemia and hyperlipidemia.  EXAM: MYOCARDIAL IMAGING WITH SPECT (REST AND EXERCISE)  GATED LEFT VENTRICULAR WALL MOTION STUDY  LEFT VENTRICULAR EJECTION FRACTION  TECHNIQUE: Standard myocardial SPECT imaging was performed after resting intravenous injection of 10 mCi Tc-41m sestamibi. Subsequently, exercise tolerance test was performed by the patient under the supervision of the Cardiology staff. At peak-stress, 30 mCi Tc-83m sestamibi was injected intravenously and standard myocardial SPECT imaging was performed. Quantitative gated imaging was also performed to evaluate left ventricular wall motion, and estimate left ventricular ejection fraction.  COMPARISON:  Two-view chest x-ray 03/09/2014.  FINDINGS: Cardiac uptake is normal. There is faint decreased uptake on stress related images along the inferior a and lateral wall to the apex. This is demonstrated in multiple planes.  Contractility and wall motion is normal.  The gated images demonstrating estimated diastolic volume is 78 mL. The estimated systolic volume is 30 mL. The calculated ejection fraction is 62%.  IMPRESSION: 1. Area of probable reversible ischemia within the inferolateral wall of the heart to the apex. 2. Normal  contractility and wall motion. 3. Normal ejection fraction of 62%.   Electronically Signed   By: Gennette Pachris  Mattern M.D.   On: 03/10/2014 16:24        Scheduled Meds: . aspirin EC  81 mg Oral Daily  . atenolol  50 mg Oral Daily   And  . chlorthalidone  25 mg Oral Daily  . atorvastatin  10 mg Oral q1800  . clonazePAM  0.5 mg Oral QHS  . docusate sodium  100 mg Oral Daily  . gabapentin  1,200 mg Oral Daily  . heparin  5,000 Units Subcutaneous 3 times per day  . minocycline  100 mg Oral BID  . naproxen  500 mg Oral BID WC  .  nicotine  21 mg Transdermal Daily  . pantoprazole  40 mg Oral Daily  . tiotropium  18 mcg Inhalation Daily  . trihexyphenidyl  2 mg Oral QHS   Continuous Infusions:   Principal Problem:   Chest pain Active Problems:   HLD (hyperlipidemia)   HTN (hypertension)    Time spent: 35 minutes    HONGALGI,ANAND, MD, FACP, FHM. [facility] Pager [phone]  If 7PM-7AM, please contact night-coverage [email or web address] Password [email or web address] 03/10/2014, 5:00 PM    LOS: 1 day

## 2014-03-10 NOTE — ED Provider Notes (Signed)
Medical screening examination/treatment/procedure(s) were conducted as a shared visit with non-physician practitioner(s) and myself.  I personally evaluated the patient during the encounter.   EKG Interpretation   Date/Time:  Sunday March 09 2014 14:00:16 EDT Ventricular Rate:  85 PR Interval:  148 QRS Duration: 82 QT Interval:  358 QTC Calculation: 426 R Axis:   31 Text Interpretation:  Normal sinus rhythm Possible Left atrial enlargement  Borderline ECG Similar to prior Confirmed by Gwendolyn GrantWALDEN  MD, Zissy Hamlett ([id number]) on  03/09/2014 4:46:01 PM      Patient here with CP. Heart score of 4. Exam benign. Admitted.  Dagmar HaitWilliam Sloan Takagi, MD 03/10/14 [phone]

## 2014-03-10 NOTE — Progress Notes (Signed)
  Echocardiogram 2D Echocardiogram has been performed.  [name] [name], [name] [name] 03/10/2014, 9:29 AM

## 2014-03-10 NOTE — Consult Note (Addendum)
Admit date: 03/09/2014 Referring Physician  Dr. Waymon Amato Primary Physician  None Primary Cardiologist  None Reason for Consultation  Chest pain  HPI: [name] [name] is a 48 y.o. male with a hx of dyslipidemia and HTN who presents with one week of intermittent exertional chest pain. He reports diaphoresis w/ chest pain. He told the hospitalist on admission that food would get stuck in his throat, causing CP. Pt also reports sob and severe fatigue over the past 2 weeks.He says that he will get SOB and break out in a sweat with the CP.   He has been noncompliant with home meds. In ED, His EKG was nonischemic.Troponin has been negative x 2. Cardiology is now asked to consult for further evaluation.    PMH:   Past Medical History  Diagnosis Date  . High cholesterol   . Hypertension   . Schizophrenia, schizo-affective      PSH:  History reviewed. No pertinent past surgical history.  Allergies:  Review of patient's allergies indicates no known allergies. Prior to Admit Meds:   Prescriptions prior to admission  Medication Sig Dispense Refill  . aspirin EC 81 MG tablet Take 81 mg by mouth daily.      Marland Kitchen atenolol-chlorthalidone (TENORETIC) 50-25 MG per tablet Take 1 tablet by mouth daily.      . Cholecalciferol (VITAMIN D-3) 5000 UNITS TABS Take 5,000 Units by mouth 2 (two) times daily.      . clonazePAM (KLONOPIN) 0.5 MG tablet Take 0.5 mg by mouth at bedtime.       . docusate sodium (COLACE) 100 MG capsule Take 100 mg by mouth daily.      Marland Kitchen gabapentin (NEURONTIN) 400 MG capsule Take 1,200 mg by mouth daily.       Marland Kitchen guaiFENesin-codeine (ROBITUSSIN AC) 100-10 MG/5ML syrup Take 10 mLs by mouth 4 (four) times daily as needed for cough.  180 mL  1  . haloperidol lactate (HALDOL) 5 MG/ML injection Inject 2.5 mg into the muscle every 28 (twenty-eight) days.      . Magnesium 250 MG TABS Take 250 mg by mouth daily.       . naproxen (NAPROSYN) 500 MG tablet Take 500 mg by mouth 2 (two) times daily with a  meal.      . omega-3 acid ethyl esters (LOVAZA) 1 G capsule Take 4 g by mouth daily.      . rosuvastatin (CRESTOR) 10 MG tablet Take 10 mg by mouth at bedtime.      Marland Kitchen tiotropium (SPIRIVA) 18 MCG inhalation capsule Place 18 mcg into inhaler and inhale daily.      . trihexyphenidyl (ARTANE) 2 MG tablet Take 2 mg by mouth at bedtime.      . minocycline (MINOCIN,DYNACIN) 100 MG capsule Take 1 capsule (100 mg total) by mouth 2 (two) times daily.  20 capsule  0   Fam HX:    Family History  Problem Relation Age of Onset  . Cancer Father   . Heart attack Maternal Grandmother   . Heart disease Maternal Grandmother   . Heart attack Maternal Grandfather   . Heart disease Maternal Grandfather   . Heart attack Paternal Grandfather   . Heart disease Paternal Grandfather    Social HX:    History   Social History  . Marital Status: Married    Spouse Name: N/A    Number of Children: N/A  . Years of Education: N/A   Occupational History  . Not on file.  Social History Main Topics  . Smoking status: Current Every Day Smoker -- 1.00 packs/day for 4 years    Types: Cigarettes  . Smokeless tobacco: Not on file  . Alcohol Use: No  . Drug Use: No  . Sexual Activity: Not on file   Other Topics Concern  . Not on file   Social History Narrative  . No narrative on file     ROS:  All 11 ROS were addressed and are negative except what is stated in the HPI  Physical Exam: Blood pressure 131/67, pulse 79, temperature 97.7 F (36.5 C), temperature source Oral, resp. rate 18, height 5\' 8"  (1.727 m), weight 185 lb 11.2 oz (84.233 kg), SpO2 96.00%.    General: Well developed, well nourished, in no acute distress Head: Eyes PERRLA, No xanthomas.   Normal cephalic and atramatic  Lungs:   Clear bilaterally to auscultation and percussion. Heart:   HRRR S1 S2 Pulses are 2+ & equal.            No carotid bruit. No JVD.  No abdominal bruits. No femoral bruits. Abdomen: Bowel sounds are positive,  abdomen soft and non-tender without masses  Extremities:   No clubbing, cyanosis or edema.  DP +1 Neuro: Alert and oriented X 3. Psych:  Good affect, responds appropriately    Labs:   Lab Results  Component Value Date   WBC 13.1* 03/10/2014   HGB 17.3* 03/10/2014   HCT 49.4 03/10/2014   MCV 104.9* 03/10/2014   PLT 149* 03/10/2014     Recent Labs Lab 03/10/14 0145  NA 138  K 3.4*  CL 96  CO2 25  BUN 13  CREATININE 0.83  CALCIUM 9.1  PROT 6.5  BILITOT 0.3  ALKPHOS 73  ALT 41  AST 25  GLUCOSE 92   No results found for this basename: PTT   No results found for this basename: INR,  PROTIME   Lab Results  Component Value Date   TROPONINI <0.30 03/10/2014     Lab Results  Component Value Date   CHOL 174 03/10/2014   Lab Results  Component Value Date   HDL 18* 03/10/2014   Lab Results  Component Value Date   LDLCALC UNABLE TO CALCULATE IF TRIGLYCERIDE OVER 400 mg/dL 1/61/09607/27/2015   Lab Results  Component Value Date   TRIG 1020* 03/10/2014   Lab Results  Component Value Date   CHOLHDL 9.7 03/10/2014   No results found for this basename: LDLDIRECT      Radiology:  Dg Chest 2 View  03/09/2014   CLINICAL DATA:  Chest pain and fatigue  EXAM: CHEST  2 VIEW  COMPARISON:  October 29, 2011  FINDINGS: There is no edema or consolidation. Heart size and pulmonary vascularity are within normal limits. No adenopathy. No pneumothorax. No bone lesions.  IMPRESSION: No edema or consolidation.   Electronically Signed   By: Bretta BangWilliam  Woodruff M.D.   On: 03/09/2014 15:14    EKG:  NSR, LAE  ASSESSMENT:  1.  Chest pain with typical and atypical components and no ischemia on EKG and normal cardiac enzymes.  He does have several cardiac risk factors including ongoing tobacco use, HTN and dyslipidemia.   2.  HTN 3.  Dyslipidemia 4.  Ongoing tobacco use  PLAN:   1.  NPO 2.  Stress myoview 3.  Smoking cessation counseling  Quintella ReichertURNER,Komal Stangelo R, MD  03/10/2014  8:06 AM

## 2014-03-10 NOTE — Progress Notes (Signed)
Utilization review completed.  

## 2014-03-10 NOTE — Progress Notes (Signed)
GXT CL performed. Target HR reached in stage 3.

## 2014-03-11 ENCOUNTER — Encounter (HOSPITAL_COMMUNITY)
Admission: EM | Disposition: A | Discharge status: Home / Self Care | Payer: Self-pay | Source: Home / Self Care | Attending: Internal Medicine

## 2014-03-11 DIAGNOSIS — D7589 Other specified diseases of blood and blood-forming organs: Secondary | ICD-10-CM | POA: Diagnosis present

## 2014-03-11 DIAGNOSIS — J449 Chronic obstructive pulmonary disease, unspecified: Secondary | ICD-10-CM | POA: Diagnosis present

## 2014-03-11 DIAGNOSIS — D696 Thrombocytopenia, unspecified: Secondary | ICD-10-CM | POA: Diagnosis present

## 2014-03-11 DIAGNOSIS — I10 Essential (primary) hypertension: Secondary | ICD-10-CM | POA: Diagnosis present

## 2014-03-11 DIAGNOSIS — K219 Gastro-esophageal reflux disease without esophagitis: Secondary | ICD-10-CM | POA: Diagnosis present

## 2014-03-11 DIAGNOSIS — I517 Cardiomegaly: Secondary | ICD-10-CM | POA: Diagnosis present

## 2014-03-11 DIAGNOSIS — R5383 Other fatigue: Secondary | ICD-10-CM | POA: Diagnosis present

## 2014-03-11 DIAGNOSIS — K224 Dyskinesia of esophagus: Secondary | ICD-10-CM | POA: Diagnosis present

## 2014-03-11 DIAGNOSIS — R5381 Other malaise: Secondary | ICD-10-CM | POA: Diagnosis present

## 2014-03-11 DIAGNOSIS — K589 Irritable bowel syndrome without diarrhea: Secondary | ICD-10-CM | POA: Diagnosis present

## 2014-03-11 DIAGNOSIS — K5900 Constipation, unspecified: Secondary | ICD-10-CM | POA: Diagnosis present

## 2014-03-11 DIAGNOSIS — R1310 Dysphagia, unspecified: Secondary | ICD-10-CM | POA: Diagnosis present

## 2014-03-11 DIAGNOSIS — F17200 Nicotine dependence, unspecified, uncomplicated: Secondary | ICD-10-CM | POA: Diagnosis present

## 2014-03-11 DIAGNOSIS — D751 Secondary polycythemia: Secondary | ICD-10-CM | POA: Diagnosis present

## 2014-03-11 DIAGNOSIS — Z91199 Patient's noncompliance with other medical treatment and regimen due to unspecified reason: Secondary | ICD-10-CM | POA: Diagnosis not present

## 2014-03-11 DIAGNOSIS — E876 Hypokalemia: Secondary | ICD-10-CM | POA: Diagnosis present

## 2014-03-11 DIAGNOSIS — F259 Schizoaffective disorder, unspecified: Secondary | ICD-10-CM | POA: Diagnosis present

## 2014-03-11 DIAGNOSIS — I2510 Atherosclerotic heart disease of native coronary artery without angina pectoris: Secondary | ICD-10-CM | POA: Diagnosis present

## 2014-03-11 DIAGNOSIS — E7800 Pure hypercholesterolemia, unspecified: Secondary | ICD-10-CM | POA: Diagnosis present

## 2014-03-11 DIAGNOSIS — E781 Pure hyperglyceridemia: Secondary | ICD-10-CM | POA: Diagnosis present

## 2014-03-11 DIAGNOSIS — R079 Chest pain, unspecified: Secondary | ICD-10-CM

## 2014-03-11 DIAGNOSIS — D72829 Elevated white blood cell count, unspecified: Secondary | ICD-10-CM | POA: Diagnosis present

## 2014-03-11 DIAGNOSIS — E860 Dehydration: Secondary | ICD-10-CM | POA: Diagnosis present

## 2014-03-11 LAB — BASIC METABOLIC PANEL
Anion gap: 17 — ABNORMAL HIGH (ref 5–15)
BUN: 15 mg/dL (ref 6–23)
CO2: 21 mEq/L (ref 19–32)
Calcium: 9.1 mg/dL (ref 8.4–10.5)
Chloride: 99 mEq/L (ref 96–112)
Creatinine, Ser: 0.77 mg/dL (ref 0.50–1.35)
GFR calc Af Amer: >90 mL/min (ref >90)
GFR calc non Af Amer: >90 mL/min (ref >90)
Glucose, Bld: 153 mg/dL — ABNORMAL HIGH (ref 70–99)
Potassium: 4.2 mEq/L (ref 3.7–5.3)
SODIUM: 137 meq/L (ref 137–147)

## 2014-03-11 LAB — IRON AND TIBC
IRON: 111 ug/dL (ref 42–135)
Saturation Ratios: 42 % (ref 20–55)
TIBC: 266 ug/dL (ref 215–435)
UIBC: 155 ug/dL (ref 125–400)

## 2014-03-11 LAB — CBC
HEMATOCRIT: 49.7 % (ref 39.0–52.0)
Hemoglobin: 17.3 g/dL — ABNORMAL HIGH (ref 13.0–17.0)
MCH: 36.6 pg — ABNORMAL HIGH (ref 26.0–34.0)
MCHC: 34.8 g/dL (ref 30.0–36.0)
MCV: 105.1 fL — ABNORMAL HIGH (ref 78.0–100.0)
Platelets: 142 10*3/uL — ABNORMAL LOW (ref 150–400)
RBC: 4.73 MIL/uL (ref 4.22–5.81)
RDW: 14.4 % (ref 11.5–15.5)
WBC: 10.3 10*3/uL (ref 4.0–10.5)

## 2014-03-11 LAB — FOLATE: Folate: 19.8 ng/mL

## 2014-03-11 LAB — FERRITIN: Ferritin: 258 ng/mL (ref 22–322)

## 2014-03-11 LAB — PROTIME-INR
INR: 0.86 (ref 0.00–1.49)
PROTHROMBIN TIME: 11.7 s (ref 11.6–15.2)

## 2014-03-11 LAB — VITAMIN B12: Vitamin B-12: 775 pg/mL (ref 211–911)

## 2014-03-11 SURGERY — LEFT HEART CATHETERIZATION WITH CORONARY ANGIOGRAM
Anesthesia: LOCAL

## 2014-03-11 MED ORDER — SODIUM CHLORIDE 0.9 % IJ SOLN: 3.0000 mL | INTRAMUSCULAR | Status: DC | PRN

## 2014-03-11 MED ORDER — SODIUM CHLORIDE 0.9 % IJ SOLN
3.0000 mL | Freq: Two times a day (BID) | INTRAMUSCULAR | Status: DC
  Administered 2014-03-11: 3 mL via INTRAVENOUS

## 2014-03-11 MED ORDER — ASPIRIN EC 81 MG PO TBEC: 81.0000 mg | DELAYED_RELEASE_TABLET | Freq: Every day | ORAL | Status: DC

## 2014-03-11 MED ORDER — SODIUM CHLORIDE 0.9 % IV SOLN: 250.0000 mL | INTRAVENOUS | Status: DC | PRN

## 2014-03-11 MED ORDER — SODIUM CHLORIDE 0.9 % IV SOLN
INTRAVENOUS | Status: DC
  Administered 2014-03-12: 04:00:00 via INTRAVENOUS

## 2014-03-11 MED ORDER — ASPIRIN 81 MG PO CHEW
81.0000 mg | CHEWABLE_TABLET | ORAL | Status: AC
  Administered 2014-03-12: 81 mg via ORAL
  Filled 2014-03-11 (×2): qty 1

## 2014-03-11 NOTE — Progress Notes (Signed)
Cath rescheduled for tomorrow.  First case with Dr. Excell Seltzerooper. Wilburt FinlayHAGER, Trindon Dorton PAC

## 2014-03-11 NOTE — Consult Note (Signed)
Consultation  Referring Provider:  [facility]    Primary Care Physician:  Massie Maroon, FNP Primary Gastroenterologist:  Dr. Theron Arista [facility])       Reason for Consultation: dysphagia              HPI:   [name] [name] is a 48 y.o. male admitted with chest pain. Stress myoview reveals ischemia inferolateral wall, patient for cath today. Patient reported a 2 month history of solid food dysphagia, mainly to dry food. He hasn't lost weight. No throat pain. No history of GERD. Barium swallow reveals mild esophageal dysmotility, delayed passage of a 13 mm barium tablet at the lower esophagus.     Patient gives a history of constipation predominant IBS. He is on a daily stool softener.   Past Medical History  Diagnosis Date  . High cholesterol   . Hypertension   . Schizophrenia, schizo-affective     History reviewed. No pertinent past surgical history.  Family History  Problem Relation Age of Onset  . Cancer Father   . Heart attack Maternal Grandmother   . Heart disease Maternal Grandmother   . Heart attack Maternal Grandfather   . Heart disease Maternal Grandfather   . Heart attack Paternal Grandfather   . Heart disease Paternal Grandfather    No colon cancer or other GI malignancy  History  Substance Use Topics  . Smoking status: Current Every Day Smoker -- 1.00 packs/day for 4 years    Types: Cigarettes  . Smokeless tobacco: Not on file  . Alcohol Use: No    Prior to Admission medications   Medication Sig Start Date End Date Taking? Authorizing Provider  aspirin EC 81 MG tablet Take 81 mg by mouth daily.   Yes Historical Provider, MD  atenolol-chlorthalidone (TENORETIC) 50-25 MG per tablet Take 1 tablet by mouth daily.   Yes Historical Provider, MD  Cholecalciferol (VITAMIN D-3) 5000 UNITS TABS Take 5,000 Units by mouth 2 (two) times daily.   Yes Historical Provider, MD  clonazePAM (KLONOPIN) 0.5 MG tablet Take 0.5 mg by mouth at bedtime.    Yes Historical  Provider, MD  docusate sodium (COLACE) 100 MG capsule Take 100 mg by mouth daily.   Yes Historical Provider, MD  gabapentin (NEURONTIN) 400 MG capsule Take 1,200 mg by mouth daily.    Yes Historical Provider, MD  guaiFENesin-codeine (ROBITUSSIN AC) 100-10 MG/5ML syrup Take 10 mLs by mouth 4 (four) times daily as needed for cough. 02/17/14  Yes Linna Hoff, MD  haloperidol lactate (HALDOL) 5 MG/ML injection Inject 2.5 mg into the muscle every 28 (twenty-eight) days.   Yes Historical Provider, MD  Magnesium 250 MG TABS Take 250 mg by mouth daily.    Yes Historical Provider, MD  naproxen (NAPROSYN) 500 MG tablet Take 500 mg by mouth 2 (two) times daily with a meal.   Yes Historical Provider, MD  omega-3 acid ethyl esters (LOVAZA) 1 G capsule Take 4 g by mouth daily.   Yes Historical Provider, MD  rosuvastatin (CRESTOR) 10 MG tablet Take 10 mg by mouth at bedtime.   Yes Historical Provider, MD  tiotropium (SPIRIVA) 18 MCG inhalation capsule Place 18 mcg into inhaler and inhale daily.   Yes Historical Provider, MD  trihexyphenidyl (ARTANE) 2 MG tablet Take 2 mg by mouth at bedtime.   Yes Historical Provider, MD  minocycline (MINOCIN,DYNACIN) 100 MG capsule Take 1 capsule (100 mg total) by mouth 2 (two) times daily. 02/17/14   Fayrene Fearing  Sallyanne Kuster Kindl, MD    Current Facility-Administered Medications  Medication Dose Route Frequency Provider Last Rate Last Dose  . 0.9 %  sodium chloride infusion  250 mL Intravenous PRN Rhonda G Barrett, PA-C      . 0.9 %  sodium chloride infusion  1 mL/kg/hr Intravenous Continuous Joline SaltRhonda G Barrett, PA-C 84.2 mL/hr at 03/11/14 0414 1 mL/kg/hr at 03/11/14 0414  . 0.9 %  sodium chloride infusion  250 mL Intravenous PRN Kathleene Hazelhristopher D McAlhany, MD      . Melene Muller[START ON 03/12/2014] 0.9 %  sodium chloride infusion   Intravenous Continuous Kathleene Hazelhristopher D McAlhany, MD      . acetaminophen (TYLENOL) tablet 650 mg  650 mg Oral Q6H PRN Jerald KiefStephen K Chiu, MD   650 mg at 03/09/14 2055   Or  .  acetaminophen (TYLENOL) suppository 650 mg  650 mg Rectal Q6H PRN Jerald KiefStephen K Chiu, MD      . Melene Muller[START ON 03/12/2014] aspirin chewable tablet 81 mg  81 mg Oral Pre-Cath Kathleene Hazelhristopher D McAlhany, MD      . Melene Muller[START ON 03/13/2014] aspirin EC tablet 81 mg  81 mg Oral Daily Elease EtienneAnand D Hongalgi, MD      . atenolol (TENORMIN) tablet 50 mg  50 mg Oral Daily Jerald KiefStephen K Chiu, MD   50 mg at 03/10/14 1515   And  . chlorthalidone (HYGROTON) tablet 25 mg  25 mg Oral Daily Jerald KiefStephen K Chiu, MD   25 mg at 03/10/14 1515  . atorvastatin (LIPITOR) tablet 10 mg  10 mg Oral q1800 Jerald KiefStephen K Chiu, MD   10 mg at 03/10/14 1726  . clonazePAM (KLONOPIN) tablet 0.5 mg  0.5 mg Oral QHS Jerald KiefStephen K Chiu, MD   0.5 mg at 03/10/14 2226  . docusate sodium (COLACE) capsule 100 mg  100 mg Oral Daily Jerald KiefStephen K Chiu, MD   100 mg at 03/10/14 1515  . fenofibrate tablet 54 mg  54 mg Oral Daily Elease EtienneAnand D Hongalgi, MD   54 mg at 03/10/14 2226  . gabapentin (NEURONTIN) capsule 1,200 mg  1,200 mg Oral Daily Jerald KiefStephen K Chiu, MD   1,200 mg at 03/10/14 1515  . heparin injection 5,000 Units  5,000 Units Subcutaneous 3 times per day Jerald KiefStephen K Chiu, MD   5,000 Units at 03/11/14 [phone]  . ipratropium-albuterol (DUONEB) 0.5-2.5 (3) MG/3ML nebulizer solution 3 mL  3 mL Nebulization Q4H PRN Jerald KiefStephen K Chiu, MD      . minocycline (MINOCIN,DYNACIN) capsule 100 mg  100 mg Oral BID Jerald KiefStephen K Chiu, MD   100 mg at 03/10/14 2226  . morphine 2 MG/ML injection 2 mg  2 mg Intravenous Q4H PRN Jerald KiefStephen K Chiu, MD      . nicotine (NICODERM CQ - dosed in mg/24 hours) patch 21 mg  21 mg Transdermal Daily Elease EtienneAnand D Hongalgi, MD   21 mg at 03/10/14 1727  . nitroGLYCERIN (NITROSTAT) SL tablet 0.4 mg  0.4 mg Sublingual Q5 min PRN Jerald KiefStephen K Chiu, MD      . ondansetron [facility](ZOFRAN) tablet 4 mg  4 mg Oral Q6H PRN Jerald KiefStephen K Chiu, MD       Or  . ondansetron [facility](ZOFRAN) injection 4 mg  4 mg Intravenous Q6H PRN Jerald KiefStephen K Chiu, MD      . pantoprazole (PROTONIX) EC tablet 40 mg  40 mg Oral Daily Elease EtienneAnand D  Hongalgi, MD   40 mg at 03/10/14 1514  . sodium chloride 0.9 % injection 3 mL  3 mL Intravenous  Q12H Joline Salt Barrett, PA-C   3 mL at 03/10/14 2226  . sodium chloride 0.9 % injection 3 mL  3 mL Intravenous PRN Rhonda G Barrett, PA-C      . sodium chloride 0.9 % injection 3 mL  3 mL Intravenous Q12H Kathleene Hazel, MD      . sodium chloride 0.9 % injection 3 mL  3 mL Intravenous PRN Kathleene Hazel, MD      . tiotropium [facility]) inhalation capsule 18 mcg  18 mcg Inhalation Daily Jerald Kief, MD   18 mcg at 03/11/14 0946  . trihexyphenidyl (ARTANE) tablet 2 mg  2 mg Oral QHS Jerald Kief, MD   2 mg at 03/10/14 2226    Allergies as of 03/09/2014  . (No Known Allergies)    Review of Systems:    All systems reviewed and negative except where noted in HPI.   Physical Exam:  Vital signs in last 24 hours: Temp:  [97.9 F (36.6 C)-98.4 F (36.9 C)] 98.2 F (36.8 C) (07/28 0641) Pulse Rate:  [75-92] 76 (07/28 0641) Resp:  [15-18] 18 (07/28 0641) BP: (122-189)/(74-86) 126/75 mmHg (07/28 0641) SpO2:  [94 %-100 %] 95 % (07/28 0641) Weight:  [184 lb 8 oz (83.689 kg)] 184 lb 8 oz (83.689 kg) (07/28 0641) Last BM Date: 03/10/14 General:   Pleasant white male in NAD Head:  Normocephalic and atraumatic. Eyes:   No icterus.   Conjunctiva pink. Ears:  Normal auditory acuity. Neck:  Supple; no masses felt Lungs:  Respirations even and unlabored. Lungs clear to auscultation bilaterally.   No wheezes, crackles, or rhonchi.  Heart:  Regular rate and rhythm Abdomen:  Soft, nondistended, nontender. Normal bowel sounds. No appreciable masses or hepatomegaly.  Rectal:  Not performed.  Msk:  Symmetrical without gross deformities.  Extremities:  Without edema. Neurologic:  Alert and  oriented x4;  grossly normal neurologically. Skin:  Intact without significant lesions or rashes. Cervical Nodes:  No significant cervical adenopathy. Psych:  Alert and cooperative. Normal affect.  LAB  RESULTS:  Recent Labs  03/09/14 1430 03/10/14 0145 03/11/14 0335  WBC 11.8* 13.1* 10.3  HGB 19.9* 17.3* 17.3*  HCT 54.9* 49.4 49.7  PLT 166 149* 142*   BMET  Recent Labs  03/09/14 1430 03/10/14 0145 03/11/14 0335  NA 138 138 137  K 3.5* 3.4* 4.2  CL 95* 96 99  CO2 25 25 21   GLUCOSE 122* 92 153*  BUN 12 13 15   CREATININE 0.79 0.83 6.57  CALCIUM 10.0 9.1 9.1   LFT  Recent Labs  03/10/14 0145  PROT 6.5  ALBUMIN 3.4*  AST 25  ALT 41  ALKPHOS 73  BILITOT 0.3   PT/INR  Recent Labs  03/11/14 0335  LABPROT 11.7  INR 0.86    STUDIES: Dg Chest 2 View  03/09/2014   CLINICAL DATA:  Chest pain and fatigue  EXAM: CHEST  2 VIEW  COMPARISON:  October 29, 2011  FINDINGS: There is no edema or consolidation. Heart size and pulmonary vascularity are within normal limits. No adenopathy. No pneumothorax. No bone lesions.  IMPRESSION: No edema or consolidation.   Electronically Signed   By: Bretta Bang M.D.   On: 03/09/2014 15:14   Dg Esophagus  03/10/2014   CLINICAL DATA:  Upper esophageal dysphagia with solids.  EXAM: ESOPHOGRAM / BARIUM SWALLOW / BARIUM TABLET STUDY  TECHNIQUE: Combined double contrast and single contrast examination performed using effervescent crystals, thick barium liquid, and  thin barium liquid. The patient was observed with fluoroscopy swallowing a 13mm barium sulphate tablet.  FLUOROSCOPY TIME:  2 min and 41 seconds  COMPARISON:  Chest radiograph of 03/09/2014  FINDINGS: Hypopharyngeal portion of the exam is unremarkable.  Double contrast evaluation of the esophagus demonstrates no mucosal abnormality.  Evaluation of primary peristalsis demonstrates occasional esophageal dysmotility with contrast stasis in the mid and lower thoracic esophagus. This is apparent only on some swallows.  Full column evaluation of the esophagus demonstrates no persistent narrowing or stricture.  Mildly delayed passage of a 13 mm barium tablet at the lower esophagus.   IMPRESSION: 1. 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 2. Delayed passage of a 13 mm barium tablet at the lower esophagus, likely secondary.   Electronically Signed   By: Jeronimo Greaves M.D.   On: 03/10/2014 13:33   PREVIOUS ENDOSCOPIES:            colonoscopy approx 4 years ago in [facility] with Dr. Noe Gens. Due for surveillance exam soon per patient   Impression / Plan:   8. 48 year old male admitted with chest pain. Myoview suggests ischemia. He is for cardiac cath today.   2. Dysphagia. Two month history of solid food dysphagia, mainly to bread / dry foods. No weight loss. No odynophagia. Barium swallow suggests mild dysmotility with delayed passage of barium tablet at lower esophagus. Patient needs eventual EGD with probable dilation. This should wait until outpatient setting when acute issues are resolved. He is able to manage dysphagia at home taking small bites of food. Patient has [place] [employer] and needs GI referral from PCP. He understands this and will make arrangements for this referral for further evaluation of dysphagia / abnormal barium study. Patient has seen a gastroenterologist in [facility] (Dr. Noe Gens) but he now resides in [place] and wants a local gastroenterologist.     Thanks   LOS: 2 days   Willette Cluster  03/11/2014, 10:24 AM      Attending physician's note   I have taken a history, examined the patient and reviewed the chart. I agree with the Advanced Practitioner's note, impression and recommendations. Soft diet for now.  Outpatient GI follow up for dysphagia as outlined above.   Meryl Dare, MD Clementeen Graham

## 2014-03-11 NOTE — Progress Notes (Signed)
Utilization review completed.  

## 2014-03-11 NOTE — Progress Notes (Signed)
SUBJECTIVE: No chest pain this am.   BP 126/75  Pulse 76  Temp(Src) 98.2 F (36.8 C) (Oral)  Resp 18  Ht 5\' 8"  (1.727 m)  Wt 184 lb 8 oz (83.689 kg)  BMI 28.06 kg/m2  SpO2 95%  Intake/Output Summary (Last 24 hours) at 03/11/14 4098 Last data filed at 03/11/14 0500  Gross per 24 hour  Intake      3 ml  Output   2100 ml  Net  -2097 ml    PHYSICAL EXAM General: Well developed, well nourished, in no acute distress. Alert and oriented x 3.  Psych:  Good affect, responds appropriately Neck: No JVD. No masses noted.  Lungs: Clear bilaterally with no wheezes or rhonci noted.  Heart: RRR with no murmurs noted. Abdomen: Bowel sounds are present. Soft, non-tender.  Extremities: No lower extremity edema.   LABS: Basic Metabolic Panel:  Recent Labs  11/91/47 0145 03/11/14 0335  NA 138 137  K 3.4* 4.2  CL 96 99  CO2 25 21  GLUCOSE 92 153*  BUN 13 15  CREATININE 0.83 0.77  CALCIUM 9.1 9.1   CBC:  Recent Labs  03/10/14 0145 03/11/14 0335  WBC 13.1* 10.3  HGB 17.3* 17.3*  HCT 49.4 49.7  MCV 104.9* 105.1*  PLT 149* 142*   Cardiac Enzymes:  Recent Labs  03/09/14 2027 03/10/14 0145 03/10/14 0825  TROPONINI <0.30 <0.30 <0.30   Fasting Lipid Panel:  Recent Labs  03/10/14 0145  CHOL 174  HDL 18*  LDLCALC UNABLE TO CALCULATE IF TRIGLYCERIDE OVER 400 mg/dL  TRIG 8295*  CHOLHDL 9.7    Current Meds: . aspirin EC  81 mg Oral Daily  . atenolol  50 mg Oral Daily   And  . chlorthalidone  25 mg Oral Daily  . atorvastatin  10 mg Oral q1800  . clonazePAM  0.5 mg Oral QHS  . docusate sodium  100 mg Oral Daily  . fenofibrate  54 mg Oral Daily  . gabapentin  1,200 mg Oral Daily  . heparin  5,000 Units Subcutaneous 3 times per day  . minocycline  100 mg Oral BID  . nicotine  21 mg Transdermal Daily  . pantoprazole  40 mg Oral Daily  . sodium chloride  3 mL Intravenous Q12H  . tiotropium  18 mcg Inhalation Daily  . trihexyphenidyl  2 mg Oral QHS    Stress myoview 03/10/14: Standard myocardial SPECT imaging was performed after resting  intravenous injection of 10 mCi Tc-24m sestamibi. Subsequently,  exercise tolerance test was performed by the patient under the  supervision of the Cardiology staff. At peak-stress, 30 mCi Tc-35m  sestamibi was injected intravenously and standard myocardial SPECT  imaging was performed. Quantitative gated imaging was also performed  to evaluate left ventricular wall motion, and estimate left  ventricular ejection fraction.  COMPARISON: Two-view chest x-ray 03/09/2014.  FINDINGS:  Cardiac uptake is normal. There is faint decreased uptake on stress  related images along the inferior a and lateral wall to the apex.  This is demonstrated in multiple planes.  Contractility and wall motion is normal.  The gated images demonstrating estimated diastolic volume is 78 mL.  The estimated systolic volume is 30 mL. The calculated ejection  fraction is 62%.  IMPRESSION:  1. Area of probable reversible ischemia within the inferolateral  wall of the heart to the apex.  2. Normal contractility and wall motion.  3. Normal ejection fraction of 62%.   ASSESSMENT AND  PLAN:  1. Chest pain: Stress myoview with ischemia inferolateral wall. Risk factors for CAD include tobacco abuse, HTN, HLD. Will plan cardiac cath today with possible PCI. Clear liquid breakfast.   Dustine [name]  7/28/20158:14 AM

## 2014-03-11 NOTE — Progress Notes (Addendum)
PROGRESS NOTE    [name] [name] ZOX:[id number] DOB: 06/11/1966 DOA: 03/09/2014 PCP: Massie Maroon, FNP Psychiatry: Vesta Mixer  HPI/Brief narrative 48 year old male patient with history of dyslipidemia, hypertension, schizoaffective/schizophrenia, ongoing tobacco abuse, presented to the ED on 03/09/14 with one week history of intermittent midsternal exertional chest pains with associated diaphoresis. He also gives a 3-4 month history of painful swallowing and sensation of food getting stuck in his food passage. He was recently started on "sleeping pills". He gives 2 months history of generalized weakness and malaise. In the ED, EKG was nonischemic, troponins were negative. Cardiology was consulted and his nuclear stress test returned positive.     Assessment/Plan:  1. Chest pain & abnormal stress myoview : EKG nonischemic. Troponins x3: Negative. Chest x-ray negative. Cardiology was consulted and performed stress Myoview which was positive. Plan for cardiac cath on 7/28 afternoon. Chest pain resolved continue aspirin, beta blocker, statin. 2. Dysphagia/odynophagia: Barium swallow shows mild esophageal dysmotility. Added PPI. He has had this problem for 3-4 months and unfortunately his appointment to see his PCP is still a month away for him to be referred to GI as outpatient. GI consultation appreciated-recommend GI referral by outpatient PCP secondary to his [place] access insurance for outpatient EGD and possible dilatation. In the interim, recommend soft diet. Outpatient GI followup. 3. Dyslipidemia: Marked hypertriglyceridemia. Continue Crestor. Added TriCor. Will need close followup as outpatient. 4. Tobacco abuse: Cessation counseled. Requests nicotine patch. 5. Schizophrenia/schizoaffective disorder:? Psych meds causing some of his malaise and generalized weakness. Outpatient followup with psychiatry 6. Hypokalemia: Replaced 7. Elevated Hb/? Secondary polycythemia & mild  thrombocytopenia:? Secondary to tobacco abuse. Has macrocytosis. Has had elevated hemoglobin up to back in March 2013. Anemia panel looks unremarkable. Peripheral smear pending. May need outpatient hematology consultation. 8. Hypertension: Controlled: Continue atenolol. Continue diuretics.    Code Status: Full Family Communication: Discussed with spouse at bedside. Disposition Plan: Home when medically stable.   Consultants:  Cardiology  Gastroenterology  Procedures:  None  Antibiotics:  None   Subjective: Denies any further chest pains. Denies complaints.  Objective: Filed Vitals:   03/10/14 1329 03/10/14 2100 03/11/14 0641 03/11/14 1426  BP: 135/79 137/79 126/75 113/70  Pulse: 92 75 76 65  Temp: 97.9 F (36.6 C) 98.4 F (36.9 C) 98.2 F (36.8 C) 98 F (36.7 C)  TempSrc: Oral Oral Oral Oral  Resp: 15 18 18 18   Height:      Weight:   83.689 kg (184 lb 8 oz)   SpO2: 94% 100% 95% 98%    Intake/Output Summary (Last 24 hours) at 03/11/14 1513 Last data filed at 03/11/14 1429  Gross per 24 hour  Intake      3 ml  Output   2625 ml  Net  -2622 ml   Filed Weights   03/09/14 1942 03/11/14 0641  Weight: 84.233 kg (185 lb 11.2 oz) 83.689 kg (184 lb 8 oz)     Exam:  General exam: Pleasant young male, sitting up comfortably in bed. Flat affect. Respiratory system: Clear. No increased work of breathing. Cardiovascular system: S1 & S2 heard, RRR. No JVD, murmurs, gallops, clicks or pedal edema. Telemetry: Sinus rhythm. Gastrointestinal system: Abdomen is nondistended, soft and nontender. Normal bowel sounds heard. Central nervous system: Alert and oriented. No focal neurological deficits. Extremities: Symmetric 5 x 5 power.   Data Reviewed: Basic Metabolic Panel:  Recent Labs Lab 03/09/14 1430 03/10/14 0145 03/11/14 0335  NA 138 138 137  K 3.5* 3.4* 4.2  CL 95* 96 99  CO2 25 25 21   GLUCOSE 122* 92 153*  BUN 12 13 15   CREATININE 0.79 0.83 0.77  CALCIUM  10.0 9.1 9.1   Liver Function Tests:  Recent Labs Lab 03/10/14 0145  AST 25  ALT 41  ALKPHOS 73  BILITOT 0.3  PROT 6.5  ALBUMIN 3.4*   No results found for this basename: LIPASE, AMYLASE,  in the last 168 hours No results found for this basename: AMMONIA,  in the last 168 hours CBC:  Recent Labs Lab 03/09/14 1430 03/10/14 0145 03/11/14 0335  WBC 11.8* 13.1* 10.3  HGB 19.9* 17.3* 17.3*  HCT 54.9* 49.4 49.7  MCV 105.6* 104.9* 105.1*  PLT 166 149* 142*   Cardiac Enzymes:  Recent Labs Lab 03/09/14 2027 03/10/14 0145 03/10/14 0825  TROPONINI <0.30 <0.30 <0.30   BNP (last 3 results)  Recent Labs  03/09/14 1407  PROBNP 61.1   CBG: No results found for this basename: GLUCAP,  in the last 168 hours  No results found for this or any previous visit (from the past 240 hour(s)).    Additional labs: 1. Fasting lipids: Cholesterol 174, triglycerides 1020, HDL 18. Unable to calculate LDL and VLDL. 2. Hemoglobin A1c: 6.1      Studies: Dg Esophagus  03/10/2014   CLINICAL DATA:  Upper esophageal dysphagia with solids.  EXAM: ESOPHOGRAM / BARIUM SWALLOW / BARIUM TABLET STUDY  TECHNIQUE: Combined double contrast and single contrast examination performed using effervescent crystals, thick barium liquid, and thin barium liquid. The patient was observed with fluoroscopy swallowing a 13mm barium sulphate tablet.  FLUOROSCOPY TIME:  2 min and 41 seconds  COMPARISON:  Chest radiograph of 03/09/2014  FINDINGS: Hypopharyngeal portion of the exam is unremarkable.  Double contrast evaluation of the esophagus demonstrates no mucosal abnormality.  Evaluation of primary peristalsis demonstrates occasional esophageal dysmotility with contrast stasis in the mid and lower thoracic esophagus. This is apparent only on some swallows.  Full column evaluation of the esophagus demonstrates no persistent narrowing or stricture.  Mildly delayed passage of a 13 mm barium tablet at the lower esophagus.   IMPRESSION: 1. 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 2. Delayed passage of a 13 mm barium tablet at the lower esophagus, likely secondary.   Electronically Signed   By: Jeronimo GreavesKyle  Talbot M.D.   On: 03/10/2014 13:33   Nm Myocar Multi W/spect W/wall Motion / Ef  03/10/2014   CLINICAL DATA:  Chest pain. Hypercholesterolemia and hyperlipidemia.  EXAM: MYOCARDIAL IMAGING WITH SPECT (REST AND EXERCISE)  GATED LEFT VENTRICULAR WALL MOTION STUDY  LEFT VENTRICULAR EJECTION FRACTION  TECHNIQUE: Standard myocardial SPECT imaging was performed after resting intravenous injection of 10 mCi Tc-741m sestamibi. Subsequently, exercise tolerance test was performed by the patient under the supervision of the Cardiology staff. At peak-stress, 30 mCi Tc-5841m sestamibi was injected intravenously and standard myocardial SPECT imaging was performed. Quantitative gated imaging was also performed to evaluate left ventricular wall motion, and estimate left ventricular ejection fraction.  COMPARISON:  Two-view chest x-ray 03/09/2014.  FINDINGS: Cardiac uptake is normal. There is faint decreased uptake on stress related images along the inferior a and lateral wall to the apex. This is demonstrated in multiple planes.  Contractility and wall motion is normal.  The gated images demonstrating estimated diastolic volume is 78 mL. The estimated systolic volume is 30 mL. The calculated ejection fraction is 62%.  IMPRESSION: 1. Area of probable reversible ischemia within the inferolateral wall of the heart to  the apex. 2. Normal contractility and wall motion. 3. Normal ejection fraction of 62%.   Electronically Signed   By: Gennette Pac M.D.   On: 03/10/2014 16:24        Scheduled Meds: . [START ON 03/12/2014] aspirin  81 mg Oral Pre-Cath  . [START ON 03/13/2014] aspirin EC  81 mg Oral Daily  . atenolol  50 mg Oral Daily   And  . chlorthalidone  25 mg Oral Daily  . atorvastatin  10 mg Oral q1800  . clonazePAM  0.5 mg  Oral QHS  . docusate sodium  100 mg Oral Daily  . fenofibrate  54 mg Oral Daily  . gabapentin  1,200 mg Oral Daily  . heparin  5,000 Units Subcutaneous 3 times per day  . minocycline  100 mg Oral BID  . nicotine  21 mg Transdermal Daily  . pantoprazole  40 mg Oral Daily  . sodium chloride  3 mL Intravenous Q12H  . sodium chloride  3 mL Intravenous Q12H  . tiotropium  18 mcg Inhalation Daily  . trihexyphenidyl  2 mg Oral QHS   Continuous Infusions: . sodium chloride 1 mL/kg/hr (03/11/14 0414)  . [START ON 03/12/2014] sodium chloride      Principal Problem:   Chest pain Active Problems:   HLD (hyperlipidemia)   HTN (hypertension)   Dysphagia, unspecified(787.20)   Tobacco abuse   Hypokalemia    Time spent: 35 minutes    Paulette Lynch, MD, FACP, FHM. [facility] Pager [phone]  If 7PM-7AM, please contact night-coverage [email or web address] Password [email or web address] 03/11/2014, 3:13 PM    LOS: 2 days

## 2014-03-12 ENCOUNTER — Encounter (HOSPITAL_COMMUNITY)
Admission: EM | Disposition: A | Discharge status: Home / Self Care | Payer: Self-pay | Source: Home / Self Care | Attending: Internal Medicine

## 2014-03-12 DIAGNOSIS — I2510 Atherosclerotic heart disease of native coronary artery without angina pectoris: Secondary | ICD-10-CM

## 2014-03-12 DIAGNOSIS — F17200 Nicotine dependence, unspecified, uncomplicated: Secondary | ICD-10-CM

## 2014-03-12 HISTORY — PX: LEFT HEART CATHETERIZATION WITH CORONARY ANGIOGRAM: SHX5451

## 2014-03-12 SURGERY — LEFT HEART CATHETERIZATION WITH CORONARY ANGIOGRAM
Anesthesia: LOCAL

## 2014-03-12 MED ORDER — NICOTINE 21 MG/24HR TD PT24: 21.0000 mg | MEDICATED_PATCH | Freq: Every day | TRANSDERMAL | Status: DC

## 2014-03-12 MED ORDER — VERAPAMIL HCL 2.5 MG/ML IV SOLN
INTRAVENOUS | Status: AC
  Filled 2014-03-12: qty 2

## 2014-03-12 MED ORDER — SODIUM CHLORIDE 0.9 % IJ SOLN: 3.0000 mL | Freq: Two times a day (BID) | INTRAMUSCULAR | Status: DC

## 2014-03-12 MED ORDER — HEPARIN (PORCINE) IN NACL 2-0.9 UNIT/ML-% IJ SOLN
INTRAMUSCULAR | Status: AC
  Filled 2014-03-12: qty 1000

## 2014-03-12 MED ORDER — SODIUM CHLORIDE 0.9 % IV SOLN: 1.0000 mL/kg/h | INTRAVENOUS | Status: DC

## 2014-03-12 MED ORDER — SODIUM CHLORIDE 0.9 % IJ SOLN: 3.0000 mL | INTRAMUSCULAR | Status: DC | PRN

## 2014-03-12 MED ORDER — PANTOPRAZOLE SODIUM 40 MG PO TBEC: 40.0000 mg | DELAYED_RELEASE_TABLET | Freq: Every day | ORAL | Status: DC

## 2014-03-12 MED ORDER — FENOFIBRATE 54 MG PO TABS: 54.0000 mg | ORAL_TABLET | Freq: Every day | ORAL | Status: DC

## 2014-03-12 MED ORDER — SODIUM CHLORIDE 0.9 % IV SOLN: 250.0000 mL | INTRAVENOUS | Status: DC | PRN

## 2014-03-12 MED ORDER — NITROGLYCERIN 1 MG/10 ML FOR IR/CATH LAB
INTRA_ARTERIAL | Status: AC
  Filled 2014-03-12: qty 10

## 2014-03-12 MED ORDER — LIDOCAINE HCL (PF) 1 % IJ SOLN
INTRAMUSCULAR | Status: AC
  Filled 2014-03-12: qty 30

## 2014-03-12 MED ORDER — NITROGLYCERIN 0.4 MG SL SUBL: 0.4000 mg | SUBLINGUAL_TABLET | SUBLINGUAL | Status: AC | PRN

## 2014-03-12 MED ORDER — MIDAZOLAM HCL 2 MG/2ML IJ SOLN
INTRAMUSCULAR | Status: AC
  Filled 2014-03-12: qty 2

## 2014-03-12 MED ORDER — FENTANYL CITRATE 0.05 MG/ML IJ SOLN
INTRAMUSCULAR | Status: AC
  Filled 2014-03-12: qty 2

## 2014-03-12 NOTE — CV Procedure (Signed)
    Cardiac Catheterization Procedure Note  Name: [name] FerrisBobby [name] MRN: [id number] DOB: [birth year]  Procedure: Left Heart Cath, Selective Coronary Angiography, LV angiography  Indication: Chest pain, abnormal nuclear scan (inferolateral ischemia)   Procedural Details: The right wrist was prepped, draped, and anesthetized with 1% lidocaine. Using the modified Seldinger technique, a 5 French sheath was introduced into the right radial artery. 3 mg of verapamil was administered through the sheath, weight-based unfractionated heparin was administered intravenously. Standard Judkins catheters were used for selective coronary angiography and left ventriculography. Catheter exchanges were performed over an exchange length guidewire. There were no immediate procedural complications. A TR band was used for radial hemostasis at the completion of the procedure.  The patient was transferred to the post catheterization recovery area for further monitoring.  Procedural Findings: Hemodynamics: AO 125/73 LV 129/16  Coronary angiography: Coronary dominance: right  Left mainstem: The left mainstem arises from the left coronary cusp. It divides into the LAD and left circumflex.  Left anterior descending (LAD): The LAD is patent throughout its distribution. The vessel wraps around the left ventricular apex. The proximal LAD has diffuse 30% stenosis. There is no high-grade disease noted throughout the LAD distribution  Left circumflex (LCx): The left circumflex is medium in caliber. There is a tiny intermediate branch. The AV circumflex supplies a single large obtuse marginal that has no significant stenosis.  Right coronary artery (RCA): The right coronary artery is dominant. The vessel is widely patent and smooth throughout its course without significant irregularity or stenoses. The PDA and PLA branches are patent.  Left ventriculography: Left ventricular systolic function is normal, LVEF is estimated at  55-65%, there is no significant mitral regurgitation   Final Conclusions:   1. Minor nonobstructive coronary artery disease, primarily affecting the proximal LAD 2. Normal left ventricular systolic function  Recommendations: Suspect noncardiac chest pain and false-positive nuclear scan. The patient's coronary arteries are widely patent and he has normal LV function. Anticipate discharge from the hospital today on medical therapy for nonobstructive CAD.  Tonny BollmanMichael Shaleen Talamantez 03/12/2014, 9:03 AM

## 2014-03-12 NOTE — Discharge Summary (Addendum)
Physician Discharge Summary  Patient ID: [name] [name] MRN: [id number] DOB/AGE: Jul 10, 1966 48 y.o.  Admit date: 03/09/2014 Discharge date: 03/12/2014  Primary Care Physician:  Massie Maroon, FNP  Discharge Diagnoses:    . atypical Chest pain . HLD (hyperlipidemia) . HTN (hypertension) . Dysphagia, unspecified(787.20) . Tobacco abuse . Hypokalemia  Consults:  Cardiology, Dr. Clifton James  Recommendations for Outpatient Follow-up:  Cardiac cath showed minor nonobstructive CAD, recommended medical therapy for nonobstructive CAD  Allergies:  No Known Allergies   Discharge Medications:   Medication List         aspirin EC 81 MG tablet  Take 81 mg by mouth daily.     atenolol-chlorthalidone 50-25 MG per tablet  Commonly known as:  TENORETIC  Take 1 tablet by mouth daily.     clonazePAM 0.5 MG tablet  Commonly known as:  KLONOPIN  Take 0.5 mg by mouth at bedtime.     docusate sodium 100 MG capsule  Commonly known as:  COLACE  Take 100 mg by mouth daily.     fenofibrate 54 MG tablet  Take 1 tablet (54 mg total) by mouth daily.     gabapentin 400 MG capsule  Commonly known as:  NEURONTIN  Take 1,200 mg by mouth daily.     guaiFENesin-codeine 100-10 MG/5ML syrup  Commonly known as:  ROBITUSSIN AC  Take 10 mLs by mouth 4 (four) times daily as needed for cough.     haloperidol lactate 5 MG/ML injection  Commonly known as:  HALDOL  Inject 2.5 mg into the muscle every 28 (twenty-eight) days.     Magnesium 250 MG Tabs  Take 250 mg by mouth daily.     minocycline 100 MG capsule  Commonly known as:  MINOCIN,DYNACIN  Take 1 capsule (100 mg total) by mouth 2 (two) times daily.     naproxen 500 MG tablet  Commonly known as:  NAPROSYN  Take 500 mg by mouth 2 (two) times daily with a meal.     nicotine 21 mg/24hr patch  Commonly known as:  NICODERM CQ - dosed in mg/24 hours  Place 1 patch (21 mg total) onto the skin daily.     nitroGLYCERIN 0.4 MG SL tablet   Commonly known as:  NITROSTAT  Place 1 tablet (0.4 mg total) under the tongue every 5 (five) minutes as needed for chest pain.     omega-3 acid ethyl esters 1 G capsule  Commonly known as:  LOVAZA  Take 4 g by mouth daily.     pantoprazole 40 MG tablet  Commonly known as:  PROTONIX  Take 1 tablet (40 mg total) by mouth daily.     rosuvastatin 10 MG tablet  Commonly known as:  CRESTOR  Take 10 mg by mouth at bedtime.     tiotropium 18 MCG inhalation capsule  Commonly known as:  SPIRIVA  Place 18 mcg into inhaler and inhale daily.     trihexyphenidyl 2 MG tablet  Commonly known as:  ARTANE  Take 2 mg by mouth at bedtime.     Vitamin D-3 5000 UNITS Tabs  Take 5,000 Units by mouth 2 (two) times daily.         Brief H and P: For complete details please refer to admission H and P, but in brief [name] [name] is a 48 y.o. male  With a hx of hld, htn, who presents with one week of constant chest pain. Reported diaphoresis w/ chest pain. On further questioning, pt reported food  getting stuck in throat, causing cp. Pt also reported sob and general weakness over the past 2 weeks. Noncompliant with home meds. In ED, pt found to have unremarkable ekg. Initial trop was neg. Hospitalist service consulted for consideration for admission.   Hospital Course:  48 year old male patient with history of dyslipidemia, hypertension, schizoaffective/schizophrenia, ongoing tobacco abuse, presented to the ED on 03/09/14 with one week history of intermittent midsternal exertional chest pains with associated diaphoresis. He also gives a 3-4 month history of painful swallowing and sensation of food getting stuck in his food passage. He was recently started on "sleeping pills". He gives 2 months history of generalized weakness and malaise. In the ED, EKG was nonischemic, troponins were negative.   Chest pain atypical likely GI related  Patient was admitted and ruled out for acute ACS. EKG showed no acute  ischemic changes suggestive of ischemia, troponins x3 negative. Chest x-ray was negative for any acute pulmonary issue. Cardiology was consulted and recommended stress test. Nuclear medicine stress test showed area probable reversible ischemia in the inferolateral wall of the heart to apex, EF of 62%   patient underwent cardiac catheterization which showed minor nonobstructive CAD, primarily affecting proximal LAD, 30% stenosis. Normal LV function, recommended medical therapy for nonobstructive CAD  Dysphagia/odynophagia: Barium swallow shows mild esophageal dysmotility. Patient was placed on PPI.  He has had this problem for 3-4 months and unfortunately his appointment to see his PCP is still a month away for him to be referred to GI as outpatient. GI was consulted and recommended GI referral by outpatient PCP secondary to his [place] access insurance for outpatient EGD and possible dilatation. In the interim, recommend soft diet. Outpatient GI followup.  Tobacco abuse Patient was counseled strongly for nicotine cessation, also placed on nicotine patches  Dyslipidemia - TriCor was added and patient was continued on Crestor  Elevated hemoglobin/ ? Secondary polycythemia, likely due to tobacco abuse, anemia panel unremarkable, may need outpatient hematology consultation.  Day of Discharge BP 123/88  Pulse 93  Temp(Src) 97.6 F (36.4 C) (Oral)  Resp 18  Ht 5\' 8"  (1.727 m)  Wt 85.004 kg (187 lb 6.4 oz)  BMI 28.50 kg/m2  SpO2 96%  Physical Exam: General: Alert and awake oriented x3 not in any acute distress. CVS: S1-S2 clear no murmur rubs or gallops Chest: clear to auscultation bilaterally, no wheezing rales or rhonchi Abdomen: soft nontender, nondistended, normal bowel sounds Extremities: no cyanosis, clubbing or edema noted bilaterally Neuro: Cranial nerves II-XII intact, no focal neurological deficits   The results of significant diagnostics from this hospitalization (including  imaging, microbiology, ancillary and laboratory) are listed below for reference.    LAB RESULTS: Basic Metabolic Panel:  Recent Labs Lab 03/10/14 0145 03/11/14 0335  NA 138 137  K 3.4* 4.2  CL 96 99  CO2 25 21  GLUCOSE 92 153*  BUN 13 15  CREATININE 0.83 0.77  CALCIUM 9.1 9.1   Liver Function Tests:  Recent Labs Lab 03/10/14 0145  AST 25  ALT 41  ALKPHOS 73  BILITOT 0.3  PROT 6.5  ALBUMIN 3.4*   No results found for this basename: LIPASE, AMYLASE,  in the last 168 hours No results found for this basename: AMMONIA,  in the last 168 hours CBC:  Recent Labs Lab 03/10/14 0145 03/11/14 0335  WBC 13.1* 10.3  HGB 17.3* 17.3*  HCT 49.4 49.7  MCV 104.9* 105.1*  PLT 149* 142*   Cardiac Enzymes:  Recent Labs Lab 03/10/14  0145 03/10/14 0825  TROPONINI <0.30 <0.30   BNP: No components found with this basename: POCBNP,  CBG: No results found for this basename: GLUCAP,  in the last 168 hours  Significant Diagnostic Studies:  Dg Chest 2 View  03/09/2014   CLINICAL DATA:  Chest pain and fatigue  EXAM: CHEST  2 VIEW  COMPARISON:  October 29, 2011  FINDINGS: There is no edema or consolidation. Heart size and pulmonary vascularity are within normal limits. No adenopathy. No pneumothorax. No bone lesions.  IMPRESSION: No edema or consolidation.   Electronically Signed   By: Bretta Bang M.D.   On: 03/09/2014 15:14   Dg Esophagus  03/10/2014   CLINICAL DATA:  Upper esophageal dysphagia with solids.  EXAM: ESOPHOGRAM / BARIUM SWALLOW / BARIUM TABLET STUDY  TECHNIQUE: Combined double contrast and single contrast examination performed using effervescent crystals, thick barium liquid, and thin barium liquid. The patient was observed with fluoroscopy swallowing a 13mm barium sulphate tablet.  FLUOROSCOPY TIME:  2 min and 41 seconds  COMPARISON:  Chest radiograph of 03/09/2014  FINDINGS: Hypopharyngeal portion of the exam is unremarkable.  Double contrast evaluation of the  esophagus demonstrates no mucosal abnormality.  Evaluation of primary peristalsis demonstrates occasional esophageal dysmotility with contrast stasis in the mid and lower thoracic esophagus. This is apparent only on some swallows.  Full column evaluation of the esophagus demonstrates no persistent narrowing or stricture.  Mildly delayed passage of a 13 mm barium tablet at the lower esophagus.  IMPRESSION: 1. 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 2. Delayed passage of a 13 mm barium tablet at the lower esophagus, likely secondary.   Electronically Signed   By: Jeronimo Greaves M.D.   On: 03/10/2014 13:33   Nm Myocar Multi W/spect W/wall Motion / Ef  03/10/2014   CLINICAL DATA:  Chest pain. Hypercholesterolemia and hyperlipidemia.  EXAM: MYOCARDIAL IMAGING WITH SPECT (REST AND EXERCISE)  GATED LEFT VENTRICULAR WALL MOTION STUDY  LEFT VENTRICULAR EJECTION FRACTION  TECHNIQUE: Standard myocardial SPECT imaging was performed after resting intravenous injection of 10 mCi Tc-76m sestamibi. Subsequently, exercise tolerance test was performed by the patient under the supervision of the Cardiology staff. At peak-stress, 30 mCi Tc-28m sestamibi was injected intravenously and standard myocardial SPECT imaging was performed. Quantitative gated imaging was also performed to evaluate left ventricular wall motion, and estimate left ventricular ejection fraction.  COMPARISON:  Two-view chest x-ray 03/09/2014.  FINDINGS: Cardiac uptake is normal. There is faint decreased uptake on stress related images along the inferior a and lateral wall to the apex. This is demonstrated in multiple planes.  Contractility and wall motion is normal.  The gated images demonstrating estimated diastolic volume is 78 mL. The estimated systolic volume is 30 mL. The calculated ejection fraction is 62%.  IMPRESSION: 1. Area of probable reversible ischemia within the inferolateral wall of the heart to the apex. 2. Normal contractility  and wall motion. 3. Normal ejection fraction of 62%.   Electronically Signed   By: Gennette Pac M.D.   On: 03/10/2014 16:24    2D ECHO: Study Conclusions  - Left ventricle: The cavity size was normal. Systolic function was normal. The estimated ejection fraction was in the range of 55% to 60%. Wall motion was normal; there were no regional wall motion abnormalities. There was an increased relative contribution of atrial contraction to ventricular filling. Doppler parameters are consistent with abnormal left ventricular relaxation (grade 1 diastolic dysfunction).    Disposition and Follow-up:  Discharge Instructions   Diet Carb Modified    Complete by:  As directed      Increase activity slowly    Complete by:  As directed             DISPOSITION: Home  DIET: Carb modified    DISCHARGE FOLLOW-UP Follow-up Information   Follow up with Massie Maroon, FNP On 04/07/2014. ( at 2:00 PM for hospital follow-up)    Specialty:  Family Medicine   Contact information:   [address] [place] [address] [phone]       Time spent on Discharge: 45 mins  Signed:   Klye Besecker M.D. [facility] 03/12/2014, 1:41 PM Pager: [phone]   **Disclaimer: This note was dictated with voice recognition software. Similar sounding words can inadvertently be transcribed and this note may contain transcription errors which may not have been corrected upon publication of note.**  Addendum Atypical chest pain likely GI from dysphagia and GERD. Cardiac workup was negative including cardiac cath.   Yesenia Locurto M.D. [facility] 03/14/2014, 12:33 PM  Pager: [phone]

## 2014-03-12 NOTE — Interval H&P Note (Signed)
History and Physical Interval Note:  03/12/2014 8:36 AM  [name] [name]  has presented today for surgery, with the diagnosis of cp  The various methods of treatment have been discussed with the patient and family. After consideration of risks, benefits and other options for treatment, the patient has consented to  Procedure(s): LEFT HEART CATHETERIZATION WITH CORONARY ANGIOGRAM (N/A) as a surgical intervention .  The patient's history has been reviewed, patient examined, no change in status, stable for surgery.  I have reviewed the patient's chart and labs.  Questions were answered to the patient's satisfaction.    Cath Lab Visit (complete for each Cath Lab visit)  Clinical Evaluation Leading to the Procedure:   ACS: Yes.    Non-ACS:    Anginal Classification: CCS IV  Anti-ischemic medical therapy: Minimal Therapy (1 class of medications)  Non-Invasive Test Results: No non-invasive testing performed  Prior CABG: No previous CABG       Tonny BollmanMichael Rayce Brahmbhatt

## 2014-03-12 NOTE — H&P (View-Only) (Signed)
SUBJECTIVE: No chest pain this am.   BP 126/75  Pulse 76  Temp(Src) 98.2 F (36.8 C) (Oral)  Resp 18  Ht 5\' 8"  (1.727 m)  Wt 184 lb 8 oz (83.689 kg)  BMI 28.06 kg/m2  SpO2 95%  Intake/Output Summary (Last 24 hours) at 03/11/14 4098 Last data filed at 03/11/14 0500  Gross per 24 hour  Intake      3 ml  Output   2100 ml  Net  -2097 ml    PHYSICAL EXAM General: Well developed, well nourished, in no acute distress. Alert and oriented x 3.  Psych:  Good affect, responds appropriately Neck: No JVD. No masses noted.  Lungs: Clear bilaterally with no wheezes or rhonci noted.  Heart: RRR with no murmurs noted. Abdomen: Bowel sounds are present. Soft, non-tender.  Extremities: No lower extremity edema.   LABS: Basic Metabolic Panel:  Recent Labs  11/91/47 0145 03/11/14 0335  NA 138 137  K 3.4* 4.2  CL 96 99  CO2 25 21  GLUCOSE 92 153*  BUN 13 15  CREATININE 0.83 0.77  CALCIUM 9.1 9.1   CBC:  Recent Labs  03/10/14 0145 03/11/14 0335  WBC 13.1* 10.3  HGB 17.3* 17.3*  HCT 49.4 49.7  MCV 104.9* 105.1*  PLT 149* 142*   Cardiac Enzymes:  Recent Labs  03/09/14 2027 03/10/14 0145 03/10/14 0825  TROPONINI <0.30 <0.30 <0.30   Fasting Lipid Panel:  Recent Labs  03/10/14 0145  CHOL 174  HDL 18*  LDLCALC UNABLE TO CALCULATE IF TRIGLYCERIDE OVER 400 mg/dL  TRIG 8295*  CHOLHDL 9.7    Current Meds: . aspirin EC  81 mg Oral Daily  . atenolol  50 mg Oral Daily   And  . chlorthalidone  25 mg Oral Daily  . atorvastatin  10 mg Oral q1800  . clonazePAM  0.5 mg Oral QHS  . docusate sodium  100 mg Oral Daily  . fenofibrate  54 mg Oral Daily  . gabapentin  1,200 mg Oral Daily  . heparin  5,000 Units Subcutaneous 3 times per day  . minocycline  100 mg Oral BID  . nicotine  21 mg Transdermal Daily  . pantoprazole  40 mg Oral Daily  . sodium chloride  3 mL Intravenous Q12H  . tiotropium  18 mcg Inhalation Daily  . trihexyphenidyl  2 mg Oral QHS    Stress myoview 03/10/14: Standard myocardial SPECT imaging was performed after resting  intravenous injection of 10 mCi Tc-24m sestamibi. Subsequently,  exercise tolerance test was performed by the patient under the  supervision of the Cardiology staff. At peak-stress, 30 mCi Tc-35m  sestamibi was injected intravenously and standard myocardial SPECT  imaging was performed. Quantitative gated imaging was also performed  to evaluate left ventricular wall motion, and estimate left  ventricular ejection fraction.  COMPARISON: Two-view chest x-ray 03/09/2014.  FINDINGS:  Cardiac uptake is normal. There is faint decreased uptake on stress  related images along the inferior a and lateral wall to the apex.  This is demonstrated in multiple planes.  Contractility and wall motion is normal.  The gated images demonstrating estimated diastolic volume is 78 mL.  The estimated systolic volume is 30 mL. The calculated ejection  fraction is 62%.  IMPRESSION:  1. Area of probable reversible ischemia within the inferolateral  wall of the heart to the apex.  2. Normal contractility and wall motion.  3. Normal ejection fraction of 62%.   ASSESSMENT AND  PLAN:  1. Chest pain: Stress myoview with ischemia inferolateral wall. Risk factors for CAD include tobacco abuse, HTN, HLD. Will plan cardiac cath today with possible PCI. Clear liquid breakfast.   MCALHANY,CHRISTOPHER  7/28/20158:14 AM

## 2014-03-12 NOTE — Care Management Note (Signed)
    Page 1 of 1   03/12/2014     1:20:27 PM CARE MANAGEMENT NOTE 03/12/2014  Patient:  [name] [name]   Account Number:  [id number]  Date Initiated:  03/12/2014  Documentation initiated by:  GRAVES-BIGELOW,Mort Smelser  Subjective/Objective Assessment:   Pt admitted for cp.     Action/Plan:   No needs from CM.   Anticipated DC Date:  03/12/2014   Anticipated DC Plan:  HOME/SELF CARE      DC Planning Services  CM consult      Choice offered to / List presented to:             Status of service:  Completed, signed off Medicare Important Message given?  YES (If response is "NO", the following Medicare IM given date fields will be blank) Date Medicare IM given:  03/12/2014 Medicare IM given by:  GRAVES-BIGELOW,Emmaleigh Longo Date Additional Medicare IM given:   Additional Medicare IM given by:    Discharge Disposition:  HOME/SELF CARE  Per UR Regulation:  Reviewed for med. necessity/level of care/duration of stay  If discussed at Long Length of Stay Meetings, dates discussed:    Comments:

## 2014-04-05 ENCOUNTER — Emergency Department (HOSPITAL_COMMUNITY)
Admission: EM | Admit: 2014-04-05 | Discharge: 2014-04-05 | Disposition: A | Discharge status: Home / Self Care | Payer: Medicare Other | Attending: Emergency Medicine | Admitting: Emergency Medicine

## 2014-04-05 ENCOUNTER — Emergency Department (HOSPITAL_COMMUNITY): Payer: Medicare Other

## 2014-04-05 ENCOUNTER — Encounter (HOSPITAL_COMMUNITY): Payer: Self-pay | Admitting: Emergency Medicine

## 2014-04-05 DIAGNOSIS — Z8659 Personal history of other mental and behavioral disorders: Secondary | ICD-10-CM | POA: Insufficient documentation

## 2014-04-05 DIAGNOSIS — I2510 Atherosclerotic heart disease of native coronary artery without angina pectoris: Secondary | ICD-10-CM | POA: Insufficient documentation

## 2014-04-05 DIAGNOSIS — R0602 Shortness of breath: Secondary | ICD-10-CM | POA: Diagnosis not present

## 2014-04-05 DIAGNOSIS — Z792 Long term (current) use of antibiotics: Secondary | ICD-10-CM | POA: Insufficient documentation

## 2014-04-05 DIAGNOSIS — I10 Essential (primary) hypertension: Secondary | ICD-10-CM | POA: Diagnosis not present

## 2014-04-05 DIAGNOSIS — E7800 Pure hypercholesterolemia, unspecified: Secondary | ICD-10-CM | POA: Diagnosis not present

## 2014-04-05 DIAGNOSIS — Z791 Long term (current) use of non-steroidal anti-inflammatories (NSAID): Secondary | ICD-10-CM | POA: Diagnosis not present

## 2014-04-05 DIAGNOSIS — R5381 Other malaise: Secondary | ICD-10-CM | POA: Diagnosis present

## 2014-04-05 DIAGNOSIS — Z7982 Long term (current) use of aspirin: Secondary | ICD-10-CM | POA: Diagnosis not present

## 2014-04-05 DIAGNOSIS — Z79899 Other long term (current) drug therapy: Secondary | ICD-10-CM | POA: Diagnosis not present

## 2014-04-05 DIAGNOSIS — F17200 Nicotine dependence, unspecified, uncomplicated: Secondary | ICD-10-CM | POA: Insufficient documentation

## 2014-04-05 DIAGNOSIS — R5383 Other fatigue: Secondary | ICD-10-CM

## 2014-04-05 LAB — URINALYSIS, ROUTINE W REFLEX MICROSCOPIC
Bilirubin Urine: NEGATIVE
GLUCOSE, UA: NEGATIVE mg/dL
Ketones, ur: NEGATIVE mg/dL
Leukocytes, UA: NEGATIVE
Nitrite: NEGATIVE
PROTEIN: NEGATIVE mg/dL
Specific Gravity, Urine: 1.02 (ref 1.005–1.030)
Urobilinogen, UA: 1 mg/dL (ref 0.0–1.0)
pH: 6 (ref 5.0–8.0)

## 2014-04-05 LAB — URINE MICROSCOPIC-ADD ON

## 2014-04-05 LAB — CBC
HCT: 49.2 % (ref 39.0–52.0)
Hemoglobin: 18.1 g/dL — ABNORMAL HIGH (ref 13.0–17.0)
MCH: 36.6 pg — AB (ref 26.0–34.0)
MCHC: 36.8 g/dL — AB (ref 30.0–36.0)
MCV: 99.4 fL (ref 78.0–100.0)
PLATELETS: 185 10*3/uL (ref 150–400)
RBC: 4.95 MIL/uL (ref 4.22–5.81)
RDW: 13.9 % (ref 11.5–15.5)
WBC: 10 10*3/uL (ref 4.0–10.5)

## 2014-04-05 LAB — BASIC METABOLIC PANEL
ANION GAP: 20 — AB (ref 5–15)
BUN: 16 mg/dL (ref 6–23)
CALCIUM: 10.6 mg/dL — AB (ref 8.4–10.5)
CO2: 21 meq/L (ref 19–32)
CREATININE: 0.8 mg/dL (ref 0.50–1.35)
Chloride: 93 mEq/L — ABNORMAL LOW (ref 96–112)
GFR calc Af Amer: >90 mL/min (ref >90)
Glucose, Bld: 128 mg/dL — ABNORMAL HIGH (ref 70–99)
Potassium: 3.6 mEq/L — ABNORMAL LOW (ref 3.7–5.3)
Sodium: 134 mEq/L — ABNORMAL LOW (ref 137–147)

## 2014-04-05 LAB — I-STAT TROPONIN, ED: Troponin i, poc: 0 ng/mL (ref 0.00–0.08)

## 2014-04-05 MED ORDER — SODIUM CHLORIDE 0.9 % IV BOLUS (SEPSIS)
500.0000 mL | Freq: Once | INTRAVENOUS | Status: AC
  Administered 2014-04-05: 500 mL via INTRAVENOUS

## 2014-04-05 NOTE — ED Provider Notes (Signed)
CSN: [id number]     Arrival date & time 04/05/14  1729 History   First MD Initiated Contact with Patient 04/05/14 1918     Chief Complaint  Patient presents with  . Fatigue     (Consider location/radiation/quality/duration/timing/severity/associated sxs/prior Treatment) HPI Comments: Patient with recent cath with non-obstructive CAD, admission from 7/26-7/29 -- presents with complaint of extreme fatigue. He did feel better for awhile but symptoms worsened over the past week. He felt like this prior to admission, but he currently is not having chest pain like he was having before. Only other complaint is some SOB with exertion and 2 episodes of loose stool yesterday. No URI symptoms. Patient does have cough but is a smoker. No abdominal pain. No urinary symptoms. No skin rashes or leg swelling. He reports no medication changes at last admission. The onset of this condition was gradual. The course is constant. Aggravating factors: none. Alleviating factors: none.    The history is provided by the patient and medical records.    Past Medical History  Diagnosis Date  . High cholesterol   . Hypertension   . Schizophrenia, schizo-affective    History reviewed. No pertinent past surgical history. Family History  Problem Relation Age of Onset  . Cancer Father   . Heart attack Maternal Grandmother   . Heart disease Maternal Grandmother   . Heart attack Maternal Grandfather   . Heart disease Maternal Grandfather   . Heart attack Paternal Grandfather   . Heart disease Paternal Grandfather    History  Substance Use Topics  . Smoking status: Current Every Day Smoker -- 1.00 packs/day for 4 years    Types: Cigarettes  . Smokeless tobacco: Not on file  . Alcohol Use: No    Review of Systems  Constitutional: Positive for fatigue. Negative for fever.  HENT: Negative for rhinorrhea and sore throat.   Eyes: Negative for redness.  Respiratory: Positive for cough and shortness of breath.    Cardiovascular: Negative for chest pain and leg swelling.  Gastrointestinal: Negative for nausea, vomiting, abdominal pain and diarrhea.  Genitourinary: Negative for dysuria.  Musculoskeletal: Negative for myalgias.  Skin: Negative for rash.  Neurological: Negative for headaches.   Allergies  Review of patient's allergies indicates no known allergies.  Home Medications   Prior to Admission medications   Medication Sig Start Date End Date Taking? Authorizing Provider  aspirin EC 81 MG tablet Take 81 mg by mouth daily.    Historical Provider, MD  atenolol-chlorthalidone (TENORETIC) 50-25 MG per tablet Take 1 tablet by mouth daily.    Historical Provider, MD  Cholecalciferol (VITAMIN D-3) 5000 UNITS TABS Take 5,000 Units by mouth 2 (two) times daily.    Historical Provider, MD  clonazePAM (KLONOPIN) 0.5 MG tablet Take 0.5 mg by mouth at bedtime.     Historical Provider, MD  docusate sodium (COLACE) 100 MG capsule Take 100 mg by mouth daily.    Historical Provider, MD  fenofibrate 54 MG tablet Take 1 tablet (54 mg total) by mouth daily. 03/12/14   Ripudeep Jenna Luo, MD  gabapentin (NEURONTIN) 400 MG capsule Take 1,200 mg by mouth daily.     Historical Provider, MD  guaiFENesin-codeine (ROBITUSSIN AC) 100-10 MG/5ML syrup Take 10 mLs by mouth 4 (four) times daily as needed for cough. 02/17/14   Linna Hoff, MD  haloperidol lactate (HALDOL) 5 MG/ML injection Inject 2.5 mg into the muscle every 28 (twenty-eight) days.    Historical Provider, MD  Magnesium 250 MG  TABS Take 250 mg by mouth daily.     Historical Provider, MD  minocycline (MINOCIN,DYNACIN) 100 MG capsule Take 1 capsule (100 mg total) by mouth 2 (two) times daily. 02/17/14   Linna Hoff, MD  naproxen (NAPROSYN) 500 MG tablet Take 500 mg by mouth 2 (two) times daily with a meal.    Historical Provider, MD  nicotine (NICODERM CQ - DOSED IN MG/24 HOURS) 21 mg/24hr patch Place 1 patch (21 mg total) onto the skin daily. 03/12/14   Ripudeep Jenna Luo, MD  nitroGLYCERIN (NITROSTAT) 0.4 MG SL tablet Place 1 tablet (0.4 mg total) under the tongue every 5 (five) minutes as needed for chest pain. 03/12/14   Ripudeep Jenna Luo, MD  omega-3 acid ethyl esters (LOVAZA) 1 G capsule Take 4 g by mouth daily.    Historical Provider, MD  pantoprazole (PROTONIX) 40 MG tablet Take 1 tablet (40 mg total) by mouth daily. 03/12/14   Ripudeep Jenna Luo, MD  rosuvastatin (CRESTOR) 10 MG tablet Take 10 mg by mouth at bedtime.    Historical Provider, MD  tiotropium (SPIRIVA) 18 MCG inhalation capsule Place 18 mcg into inhaler and inhale daily.    Historical Provider, MD  trihexyphenidyl (ARTANE) 2 MG tablet Take 2 mg by mouth at bedtime.    Historical Provider, MD   BP 144/86  Pulse 92  Temp(Src) 98.1 F (36.7 C) (Oral)  Resp 16  SpO2 96%  Physical Exam  Nursing note and vitals reviewed. Constitutional: He appears well-developed and well-nourished.  HENT:  Head: Normocephalic and atraumatic.  Mouth/Throat: Mucous membranes are normal. Mucous membranes are not dry.  Eyes: Conjunctivae are normal. Right eye exhibits no discharge. Left eye exhibits no discharge.  Neck: Trachea normal and normal range of motion. Neck supple. Normal carotid pulses and no JVD present. No muscular tenderness present. Carotid bruit is not present. No tracheal deviation present.  Cardiovascular: Normal rate, regular rhythm, S1 normal, S2 normal, normal heart sounds and intact distal pulses.  Exam reveals no distant heart sounds and no decreased pulses.   No murmur heard. Pulmonary/Chest: Effort normal and breath sounds normal. No respiratory distress. He has no wheezes. He exhibits no tenderness.  Abdominal: Soft. Normal aorta and bowel sounds are normal. There is no tenderness. There is no rebound and no guarding.  Musculoskeletal: He exhibits no edema.  Neurological: He is alert.  Skin: Skin is warm and dry. He is not diaphoretic. No cyanosis. No pallor.  Psychiatric: He has a normal  mood and affect.    ED Course  Procedures (including critical care time) Labs Review Labs Reviewed  CBC - Abnormal; Notable for the following:    Hemoglobin 18.1 (*)    MCH 36.6 (*)    MCHC 36.8 (*)    All other components within normal limits  BASIC METABOLIC PANEL - Abnormal; Notable for the following:    Sodium 134 (*)    Potassium 3.6 (*)    Chloride 93 (*)    Glucose, Bld 128 (*)    Calcium 10.6 (*)    Anion gap 20 (*)    All other components within normal limits  URINALYSIS, ROUTINE W REFLEX MICROSCOPIC - Abnormal; Notable for the following:    Hgb urine dipstick TRACE (*)    All other components within normal limits  URINE MICROSCOPIC-ADD ON  Rosezena Sensor, ED    Imaging Review Dg Chest 2 View  04/05/2014   CLINICAL DATA:  Shortness of breath.  EXAM: CHEST  2 VIEW  COMPARISON:  03/09/2014  FINDINGS: The heart size and mediastinal contours are within normal limits. Both lungs are clear. The visualized skeletal structures are unremarkable.  IMPRESSION: No active cardiopulmonary disease.   Electronically Signed   By: Signa Kellaylor  Stroud M.D.   On: 04/05/2014 20:41     EKG Interpretation None      7:45 PM Patient seen and examined. Work-up initiated. Medications ordered. EKG reviewed.   Vital signs reviewed and are as follows: BP 144/86  Pulse 92  Temp(Src) 98.1 F (36.7 C) (Oral)  Resp 16  SpO2 96%  Patient discussed with and seen by Dr. Gwendolyn GrantWalden. Labs are reassuring. Patient wants to go home. He feels better after hydration.   Patient has PCP appointment scheduled for next week. Feel he is safe for d/c to home. No CP but patient was counseled to return with severe chest pain, especially if the pain is crushing or pressure-like and spreads to the arms, back, neck, or jaw, or if they have sweating, nausea, or shortness of breath with the pain. They were encouraged to call 911 with these symptoms.   They were also told to return if their chest pain gets worse and does  not go away with rest, they have an attack of chest pain lasting longer than usual despite rest and treatment with the medications their caregiver has prescribed, if they wake from sleep with chest pain or shortness of breath, if they feel dizzy or faint, if they have chest pain not typical of their usual pain, or if they have any other emergent concerns regarding their health.  The patient verbalized understanding and agreed.   BP 141/73  Pulse 94  Temp(Src) 98.1 F (36.7 C) (Oral)  Resp 18  SpO2 97%  MDM   Final diagnoses:  Other fatigue   Patient with non-specific fatigue. Recent cath without obstructive disease. No chest pain. Trop neg. EKG unchanged. Patient has elevated hgb, chronic polycythemia 2/2 smoking vs possible mild hemoconcentration. Mild electrolyte abnormalities, consistent with mild dehydration. Improved clinically with fluids. He will likely need further testing as outpatient. No indication of infection on UA or CXR. Patient appears well. He does not want admission. Feel he is appropriate for d/c to home with close PCP follow-up for eval of fatigue.   No dangerous or life-threatening conditions suspected or identified by history, physical exam, and by work-up. No indications for hospitalization identified.      [name] CriglerJoshua Quetzali Heinle, PA-C 04/06/14 0003

## 2014-04-05 NOTE — ED Notes (Signed)
Pt c/o fatigue x 2 wks.  Was in [facility] about a month ago and had a cardiac cath.  States that they found a small blockage but it was so small that it did not qualify for stent placement. Otherwise clean cath.  C/o fatigue since he got home from the hospital.  Denies any pain.

## 2014-04-05 NOTE — Discharge Instructions (Signed)
Please read and follow all provided instructions.  Your diagnoses today include:  1. Other fatigue     Tests performed today include:  An EKG of your heart  A chest x-ray - normal  Cardiac enzymes - a blood test for heart muscle damage  Blood counts and electrolytes  Vital signs. See below for your results today.   Medications prescribed:   None  Take any prescribed medications only as directed.  Follow-up instructions: Please follow-up with your primary care provider as soon as you can for further evaluation of your symptoms.   Return instructions:  SEEK IMMEDIATE MEDICAL ATTENTION IF:  You have severe chest pain, especially if the pain is crushing or pressure-like and spreads to the arms, back, neck, or jaw, or if you have sweating, nausea (feeling sick to your stomach), or shortness of breath. THIS IS AN EMERGENCY. Don't wait to see if the pain will go away. Get medical help at once. Call 911 or 0 (operator). DO NOT drive yourself to the hospital.   Your chest pain gets worse and does not go away with rest.   You have an attack of chest pain lasting longer than usual, despite rest and treatment with the medications your caregiver has prescribed.   You wake from sleep with chest pain or shortness of breath.  You feel dizzy or faint.  You have chest pain not typical of your usual pain for which you originally saw your caregiver.   You have any other emergent concerns regarding your health.  Your vital signs today were: BP 141/73   Pulse 94   Temp(Src) 98.1 F (36.7 C) (Oral)   Resp 18   SpO2 97% If your blood pressure (BP) was elevated above 135/85 this visit, please have this repeated by your doctor within one month. --------------

## 2014-04-06 NOTE — ED Provider Notes (Signed)
Medical screening examination/treatment/procedure(s) were conducted as a shared visit with non-physician practitioner(s) and myself.  I personally evaluated the patient during the encounter.   EKG Interpretation None      Patient here with general malaise. Recent cardiac cath that was normal after abnormal stress test. Denies fevers, vomiting, SOB, CP. Feeling better after fluids. Exam benign. Labs ok. Patient stable for discharge.  Elwin Mocha, MD 04/06/14 [phone]

## 2014-04-07 ENCOUNTER — Ambulatory Visit (INDEPENDENT_AMBULATORY_CARE_PROVIDER_SITE_OTHER): Payer: Medicare Other | Admitting: Family Medicine

## 2014-04-07 ENCOUNTER — Encounter: Payer: Self-pay | Admitting: Family Medicine

## 2014-04-07 VITALS — BP 154/87 | HR 95 | Temp 98.0°F | Resp 18 | Ht 68.0 in | Wt 191.0 lb

## 2014-04-07 DIAGNOSIS — E785 Hyperlipidemia, unspecified: Secondary | ICD-10-CM

## 2014-04-07 DIAGNOSIS — M79671 Pain in right foot: Secondary | ICD-10-CM

## 2014-04-07 DIAGNOSIS — R5381 Other malaise: Secondary | ICD-10-CM | POA: Insufficient documentation

## 2014-04-07 DIAGNOSIS — Z8719 Personal history of other diseases of the digestive system: Secondary | ICD-10-CM

## 2014-04-07 DIAGNOSIS — F17200 Nicotine dependence, unspecified, uncomplicated: Secondary | ICD-10-CM

## 2014-04-07 DIAGNOSIS — R635 Abnormal weight gain: Secondary | ICD-10-CM | POA: Insufficient documentation

## 2014-04-07 DIAGNOSIS — M79672 Pain in left foot: Secondary | ICD-10-CM | POA: Insufficient documentation

## 2014-04-07 DIAGNOSIS — Z23 Encounter for immunization: Secondary | ICD-10-CM

## 2014-04-07 DIAGNOSIS — M79609 Pain in unspecified limb: Secondary | ICD-10-CM

## 2014-04-07 DIAGNOSIS — Z131 Encounter for screening for diabetes mellitus: Secondary | ICD-10-CM

## 2014-04-07 DIAGNOSIS — I10 Essential (primary) hypertension: Secondary | ICD-10-CM

## 2014-04-07 DIAGNOSIS — R5383 Other fatigue: Secondary | ICD-10-CM

## 2014-04-07 MED ORDER — NICOTINE 21 MG/24HR TD PT24: 21.0000 mg | MEDICATED_PATCH | Freq: Every day | TRANSDERMAL | Status: DC

## 2014-04-07 MED ORDER — GABAPENTIN 300 MG PO CAPS: 300.0000 mg | ORAL_CAPSULE | Freq: Three times a day (TID) | ORAL | Status: DC

## 2014-04-07 NOTE — Progress Notes (Signed)
Subjective:    Patient ID: [name] [name], male    DOB: 1965-11-30, 48 y.o.   MRN: [id number]  HPI  Patient is in office to establish care. He states that he was followed by [facility] prior to some changes in insurance coverage.   [name] [name] states that he is ready to quit smoking. He reports that he has been smoking greater than a pack per day lately. He has not attempted any smoking interventions in the past. He has a 30 year smoking history. He denies shortness of breath, chest pains, or orthopnea.    Patient here for follow-up of hypertension. He is not exercising and is not adherent to low salt diet.  Blood pressure is not well controlled at home. Cardiac symptoms include fatigue. Patient denies chest pain, dyspnea, fatigue, lower extremity edema, orthopnea, palpitations and syncope.  Cardiovascular risk factors: dyslipidemia, hypertension and smoking/ tobacco exposure.   Patient also reports a history of hyperlipidemia. There is not a family history of hyperlipidemia. There is not a family history of early ischemia heart disease.   Complaining of foot pain bilaterally to the bottom of feet. He reports that foot pain increases when applying full weight. He reports that he is currently taking 1200 mg of Gabapentin at night. He states that he has been taking current dosage for greater than 1 year.   Patient states that he is followed by [facility] for schizophrenia. He is followed every month. He sees a physician at [facility]. He reports that he receives a Haldol injection every 4 weeks.    Past Medical History  Diagnosis Date  . High cholesterol   . Hypertension   . Schizophrenia, schizo-affective    Review of Systems  Constitutional: Negative.   HENT: Negative.   Eyes: Negative.   Respiratory: Negative.   Cardiovascular: Negative.  Negative for palpitations and leg swelling.  Endocrine: Negative.   Genitourinary: Negative.   Musculoskeletal: Positive for myalgias  (bilateral foot pain).  Skin: Negative.   Allergic/Immunologic: Negative.   Neurological: Negative.   Hematological: Negative.   Psychiatric/Behavioral: Negative.        Objective:   Physical Exam  Constitutional: He is oriented to person, place, and time. He appears well-developed and well-nourished.  HENT:  Head: Normocephalic and atraumatic.  Right Ear: External ear normal.  Eyes: Conjunctivae and EOM are normal. Pupils are equal, round, and reactive to light.  Neck: Trachea normal and normal range of motion. Neck supple.  Cardiovascular: Normal rate, regular rhythm, S1 normal and normal heart sounds.   Pulmonary/Chest: Effort normal and breath sounds normal.  Abdominal: Soft. Bowel sounds are normal.  Musculoskeletal: Normal range of motion.  Neurological: He is alert and oriented to person, place, and time.  Skin: Skin is warm and dry.  Psychiatric: Judgment and thought content normal. His mood appears anxious. His affect is not inappropriate. Thought content is not delusional. He does not express impulsivity. He expresses no homicidal and no suicidal ideation.         BP 154/87  Pulse 95  Temp(Src) 98 F (36.7 C) (Oral)  Resp 18  Ht  (1.727 [name])  Wt 191 lb (86.637 kg)  BMI 29.05 kg/m2 Assessment & Plan:  1. Tobacco dependence [name] [name] states that he is ready to quit. He reports that he has been smoking 1 pack per day since he was 48 years old.  - nicotine (EQ NICOTINE) 21 mg/24hr patch; Place 1 patch (21 mg total) onto the skin  daily.  Dispense: 28 patch; Refill: 0  2. Bilateral foot pain -He reports that he has been wearing the same tracking boots for 2 years. Recommended that he add inserts to shoes or purchase shoes that support arch. Changed current Gabapentin dosage. Reduced to 300 mg TID.   3. HLD (hyperlipidemia) Reviewed last lipid panel. Triglycerides elevated - Lipid Panel -Reviewed EKG from 04/05/2014 4. Essential hypertension  Blood pressure  elevated. He states that he has been smoking a great deal lately and is stressed by inability to sleep. Also, he has not taken medication today.  -Continue current medication regimen.  5. Other malaise and fatigue - TSH  6. Weight gain He states that he has been sedentary and has not been able to exercise.  - TSH -HbA1C 7. History of IBS Recommend that he continues a high fiber diet and increase water intake to 6-8 glasses.   8. Screening for diabetes mellitus [name] [name] is complaining of fatigue and weight gain primarily to abdominal area. He states that he has been feeling fatigued lately  9. History of schizophrenia:  Stable. Patient is followed by [facility]. He states that he receives Haldol injections every 4 weeks. .   10. Immunization due - Pneumococcal polysaccharide vaccine 23-valent greater than or equal to 2yo subcutaneous/IM - Flu Vaccine QUAD 36+ mos IM  Will review medical records as they become available.  Meds ordered this encounter  Medications  . gabapentin (NEURONTIN) 300 MG capsule    Sig: Take 1 capsule (300 mg total) by mouth 3 (three) times daily.    Dispense:  120 capsule    Refill:  1    Order Specific Question:  Supervising Provider    Answer:  MATTHEWS, MICHELLE A [[id number]]  . nicotine (EQ NICOTINE) 21 mg/24hr patch    Sig: Place 1 patch (21 mg total) onto the skin daily.    Dispense:  28 patch    Refill:  0   [name] [name],[name] M, FNP

## 2014-04-07 NOTE — Patient Instructions (Signed)
Hypertension Hypertension, commonly called high blood pressure, is when the force of blood pumping through your arteries is too strong. Your arteries are the blood vessels that carry blood from your heart throughout your body. A blood pressure reading consists of a higher number over a lower number, such as 110/72. The higher number (systolic) is the pressure inside your arteries when your heart pumps. The lower number (diastolic) is the pressure inside your arteries when your heart relaxes. Ideally you want your blood pressure below 120/80. Hypertension forces your heart to work harder to pump blood. Your arteries may become narrow or stiff. Having hypertension puts you at risk for heart disease, stroke, and other problems.  RISK FACTORS Some risk factors for high blood pressure are controllable. Others are not.  Risk factors you cannot control include:   Race. You may be at higher risk if you are African American.  Age. Risk increases with age.  Gender. Men are at higher risk than women before age 45 years. After age 65, women are at higher risk than men. Risk factors you can control include:  Not getting enough exercise or physical activity.  Being overweight.  Getting too much fat, sugar, calories, or salt in your diet.  Drinking too much alcohol. SIGNS AND SYMPTOMS Hypertension does not usually cause signs or symptoms. Extremely high blood pressure (hypertensive crisis) may cause headache, anxiety, shortness of breath, and nosebleed. DIAGNOSIS  To check if you have hypertension, your health care provider will measure your blood pressure while you are seated, with your arm held at the level of your heart. It should be measured at least twice using the same arm. Certain conditions can cause a difference in blood pressure between your right and left arms. A blood pressure reading that is higher than normal on one occasion does not mean that you need treatment. If one blood pressure reading  is high, ask your health care provider about having it checked again. TREATMENT  Treating high blood pressure includes making lifestyle changes and possibly taking medicine. Living a healthy lifestyle can help lower high blood pressure. You may need to change some of your habits. Lifestyle changes may include:  Following the DASH diet. This diet is high in fruits, vegetables, and whole grains. It is low in salt, red meat, and added sugars.  Getting at least 2 hours of brisk physical activity every week.  Losing weight if necessary.  Not smoking.  Limiting alcoholic beverages.  Learning ways to reduce stress. If lifestyle changes are not enough to get your blood pressure under control, your health care provider may prescribe medicine. You may need to take more than one. Work closely with your health care provider to understand the risks and benefits. HOME CARE INSTRUCTIONS  Have your blood pressure rechecked as directed by your health care provider.   Take medicines only as directed by your health care provider. Follow the directions carefully. Blood pressure medicines must be taken as prescribed. The medicine does not work as well when you skip doses. Skipping doses also puts you at risk for problems.   Do not smoke.   Monitor your blood pressure at home as directed by your health care provider. SEEK MEDICAL CARE IF:   You think you are having a reaction to medicines taken.  You have recurrent headaches or feel dizzy.  You have swelling in your ankles.  You have trouble with your vision. SEEK IMMEDIATE MEDICAL CARE IF:  You develop a severe headache or confusion.    You have unusual weakness, numbness, or feel faint.  You have severe chest or abdominal pain.  You vomit repeatedly.  You have trouble breathing. MAKE SURE YOU:   Understand these instructions.  Will watch your condition.  Will get help right away if you are not doing well or get worse. Document  Released: 08/01/2005 Document Revised: 12/16/2013 Document Reviewed: 05/24/2013 [facility] Patient Information 2015 [place], [place]. This information is not intended to replace advice given to you by your health care provider. Make sure you discuss any questions you have with your health care provider. Food Choices to Lower Your Triglycerides  Triglycerides are a type of fat in your blood. High levels of triglycerides can increase the risk of heart disease and stroke. If your triglyceride levels are high, the foods you eat and your eating habits are very important. Choosing the right foods can help lower your triglycerides.  WHAT GENERAL GUIDELINES DO I NEED TO FOLLOW?  Lose weight if you are overweight.   Limit or avoid alcohol.   Fill one half of your plate with vegetables and green salads.   Limit fruit to two servings a day. Choose fruit instead of juice.   Make one fourth of your plate whole grains. Look for the word "whole" as the first word in the ingredient list.  Fill one fourth of your plate with lean protein foods.  Enjoy fatty fish (such as salmon, mackerel, sardines, and tuna) three times a week.   Choose healthy fats.   Limit foods high in starch and sugar.  Eat more home-cooked food and less restaurant, buffet, and fast food.  Limit fried foods.  Cook foods using methods other than frying.  Limit saturated fats.  Check ingredient lists to avoid foods with partially hydrogenated oils (trans fats) in them. WHAT FOODS CAN I EAT?  Grains Whole grains, such as whole wheat or whole grain breads, crackers, cereals, and pasta. Unsweetened oatmeal, bulgur, barley, quinoa, or brown rice. Corn or whole wheat flour tortillas.  Vegetables Fresh or frozen vegetables (raw, steamed, roasted, or grilled). Green salads. Fruits All fresh, canned (in natural juice), or frozen fruits. Meat and Other Protein Products Ground beef (85% or leaner), grass-fed beef, or beef trimmed  of fat. Skinless chicken or [place]. Ground chicken or [place]. Pork trimmed of fat. All fish and seafood. Eggs. Dried beans, peas, or lentils. Unsalted nuts or seeds. Unsalted canned or dry beans. Dairy Low-fat dairy products, such as skim or 1% milk, 2% or reduced-fat cheeses, low-fat ricotta or cottage cheese, or plain low-fat yogurt. Fats and Oils Tub margarines without trans fats. Light or reduced-fat mayonnaise and salad dressings. Avocado. Safflower, olive, or canola oils. Natural peanut or almond butter. The items listed above may not be a complete list of recommended foods or beverages. Contact your dietitian for more options. WHAT FOODS ARE NOT RECOMMENDED?  Grains White bread. White pasta. White rice. Cornbread. Bagels, pastries, and croissants. Crackers that contain trans fat. Vegetables White potatoes. Corn. Creamed or fried vegetables. Vegetables in a cheese sauce. Fruits Dried fruits. Canned fruit in light or heavy syrup. Fruit juice. Meat and Other Protein Products Fatty cuts of meat. Ribs, chicken wings, bacon, sausage, bologna, salami, chitterlings, fatback, hot dogs, bratwurst, and packaged luncheon meats. Dairy Whole or 2% milk, cream, half-and-half, and cream cheese. Whole-fat or sweetened yogurt. Full-fat cheeses. Nondairy creamers and whipped toppings. Processed cheese, cheese spreads, or cheese curds. Sweets and Desserts Corn syrup, sugars, honey, and molasses. Candy. Jam and jelly. Syrup.  Sweetened cereals. Cookies, pies, cakes, donuts, muffins, and ice cream. Fats and Oils Butter, stick margarine, lard, shortening, ghee, or bacon fat. Coconut, palm kernel, or palm oils. Beverages Alcohol. Sweetened drinks (such as sodas, lemonade, and fruit drinks or punches). The items listed above may not be a complete list of foods and beverages to avoid. Contact your dietitian for more information. Document Released: 05/19/2004 Document Revised: 08/06/2013 Document Reviewed:  06/05/2013 [facility] Patient Information 2015 [place], [place]. This information is not intended to replace advice given to you by your health care provider. Make sure you discuss any questions you have with your health care provider. Smoking Cessation Quitting smoking is important to your health and has many advantages. However, it is not always easy to quit since nicotine is a very addictive drug. Oftentimes, people try 3 times or more before being able to quit. This document explains the best ways for you to prepare to quit smoking. Quitting takes hard work and a lot of effort, but you can do it. ADVANTAGES OF QUITTING SMOKING  You will live longer, feel better, and live better.  Your body will feel the impact of quitting smoking almost immediately.  Within 20 minutes, blood pressure decreases. Your pulse returns to its normal level.  After 8 hours, carbon monoxide levels in the blood return to normal. Your oxygen level increases.  After 24 hours, the chance of having a heart attack starts to decrease. Your breath, hair, and body stop smelling like smoke.  After 48 hours, damaged nerve endings begin to recover. Your sense of taste and smell improve.  After 72 hours, the body is virtually free of nicotine. Your bronchial tubes relax and breathing becomes easier.  After 2 to 12 weeks, lungs can hold more air. Exercise becomes easier and circulation improves.  The risk of having a heart attack, stroke, cancer, or lung disease is greatly reduced.  After 1 year, the risk of coronary heart disease is cut in half.  After 5 years, the risk of stroke falls to the same as a nonsmoker.  After 10 years, the risk of lung cancer is cut in half and the risk of other cancers decreases significantly.  After 15 years, the risk of coronary heart disease drops, usually to the level of a nonsmoker.  If you are pregnant, quitting smoking will improve your chances of having a healthy baby.  The people  you live with, especially any children, will be healthier.  You will have extra money to spend on things other than cigarettes. QUESTIONS TO THINK ABOUT BEFORE ATTEMPTING TO QUIT You may want to talk about your answers with your health care provider.  Why do you want to quit?  If you tried to quit in the past, what helped and what did not?  What will be the most difficult situations for you after you quit? How will you plan to handle them?  Who can help you through the tough times? Your family? Friends? A health care provider?  What pleasures do you get from smoking? What ways can you still get pleasure if you quit? Here are some questions to ask your health care provider:  How can you help me to be successful at quitting?  What medicine do you think would be best for me and how should I take it?  What should I do if I need more help?  What is smoking withdrawal like? How can I get information on withdrawal? GET READY  Set a quit date.  Change your environment by getting rid of all cigarettes, ashtrays, matches, and lighters in your home, car, or work. Do not let people smoke in your home.  Review your past attempts to quit. Think about what worked and what did not. GET SUPPORT AND ENCOURAGEMENT You have a better chance of being successful if you have help. You can get support in many ways.  Tell your family, friends, and coworkers that you are going to quit and need their support. Ask them not to smoke around you.  Get individual, group, or telephone counseling and support. Programs are available at [employer] and health centers. Call your local health department for information about programs in your area.  Spiritual beliefs and practices may help some smokers quit.  Download a "quit meter" on your computer to keep track of quit statistics, such as how long you have gone without smoking, cigarettes not smoked, and money saved.  Get a self-help book about quitting  smoking and staying off tobacco. LEARN NEW SKILLS AND BEHAVIORS  Distract yourself from urges to smoke. Talk to someone, go for a walk, or occupy your time with a task.  Change your normal routine. Take a different route to work. Drink tea instead of coffee. Eat breakfast in a different place.  Reduce your stress. Take a hot bath, exercise, or read a book.  Plan something enjoyable to do every day. Reward yourself for not smoking.  Explore interactive web-based programs that specialize in helping you quit. GET MEDICINE AND USE IT CORRECTLY Medicines can help you stop smoking and decrease the urge to smoke. Combining medicine with the above behavioral methods and support can greatly increase your chances of successfully quitting smoking.  Nicotine replacement therapy helps deliver nicotine to your body without the negative effects and risks of smoking. Nicotine replacement therapy includes nicotine gum, lozenges, inhalers, nasal sprays, and skin patches. Some may be available over-the-counter and others require a prescription.  Antidepressant medicine helps people abstain from smoking, but how this works is unknown. This medicine is available by prescription.  Nicotinic receptor partial agonist medicine simulates the effect of nicotine in your brain. This medicine is available by prescription. Ask your health care provider for advice about which medicines to use and how to use them based on your health history. Your health care provider will tell you what side effects to look out for if you choose to be on a medicine or therapy. Carefully read the information on the package. Do not use any other product containing nicotine while using a nicotine replacement product.  RELAPSE OR DIFFICULT SITUATIONS Most relapses occur within the first 3 months after quitting. Do not be discouraged if you start smoking again. Remember, most people try several times before finally quitting. You may have symptoms  of withdrawal because your body is used to nicotine. You may crave cigarettes, be irritable, feel very hungry, cough often, get headaches, or have difficulty concentrating. The withdrawal symptoms are only temporary. They are strongest when you first quit, but they will go away within 10-14 days. To reduce the chances of relapse, try to:  Avoid drinking alcohol. Drinking lowers your chances of successfully quitting.  Reduce the amount of caffeine you consume. Once you quit smoking, the amount of caffeine in your body increases and can give you symptoms, such as a rapid heartbeat, sweating, and anxiety.  Avoid smokers because they can make you want to smoke.  Do not let weight gain distract you. Many smokers will gain  weight when they quit, usually less than 10 pounds. Eat a healthy diet and stay active. You can always lose the weight gained after you quit.  Find ways to improve your mood other than smoking. FOR MORE INFORMATION  www.smokefree.gov  Document Released: 07/26/2001 Document Revised: 12/16/2013 Document Reviewed: 11/10/2011 [facility] Patient Information 2015 [place], [place]. This information is not intended to replace advice given to you by your health care provider. Make sure you discuss any questions you have with your health care provider.

## 2014-04-08 LAB — LIPID PANEL
Cholesterol: 299 mg/dL — ABNORMAL HIGH (ref 0–200)
HDL: 30 mg/dL — AB (ref >39)
Total CHOL/HDL Ratio: 10 Ratio
Triglycerides: 1220 mg/dL — ABNORMAL HIGH (ref <150)

## 2014-04-08 LAB — TSH: TSH: 0.627 u[IU]/mL (ref 0.350–4.500)

## 2014-04-10 ENCOUNTER — Telehealth: Payer: Self-pay | Admitting: Family Medicine

## 2014-04-10 DIAGNOSIS — E781 Pure hyperglyceridemia: Secondary | ICD-10-CM

## 2014-04-10 DIAGNOSIS — E785 Hyperlipidemia, unspecified: Secondary | ICD-10-CM

## 2014-04-10 MED ORDER — FENOFIBRATE 48 MG PO TABS: 145.0000 mg | ORAL_TABLET | Freq: Every day | ORAL | Status: DC

## 2014-04-10 MED ORDER — ROSUVASTATIN CALCIUM 20 MG PO TABS: 20.0000 mg | ORAL_TABLET | Freq: Every day | ORAL | Status: DC

## 2014-04-10 NOTE — Telephone Encounter (Signed)
Meds ordered this encounter  Medications  . rosuvastatin (CRESTOR) 20 MG tablet    Sig: Take 1 tablet (20 mg total) by mouth at bedtime.    Dispense:  30 tablet    Refill:  2    Order Specific Question:  Supervising Provider    Answer:  MATTHEWS, MICHELLE A [[id number]]  . fenofibrate (TRICOR) 48 MG tablet    Sig: Take 3 tablets (145 mg total) by mouth daily.    Dispense:  30 tablet    Refill:  2    Order Specific Question:  Supervising Provider    Answer:  Marthann Schiller A [[id number]]  Reviewed labs, triglycerides are increasing. Current triglyceride 1220. Added medication to current regimen. Patient to continue lowfat, low carbohydrate diet as discussed during appointment.

## 2014-04-11 ENCOUNTER — Telehealth: Payer: Self-pay

## 2014-04-11 NOTE — Telephone Encounter (Signed)
Called and advised patient of Lab results per china's  Previous message. Informed patient to pick up new rx's and take as directed, stick to low fat, low carb diet, and to keep next scheduled appointment. Patient verbalized understanding. Thanks!

## 2014-04-14 ENCOUNTER — Telehealth: Payer: Self-pay

## 2014-04-14 NOTE — Telephone Encounter (Signed)
Patient's wife has called, complaining that patient can not sleep and dr. Ashley Royalty didn't give him anything for his nerves. She wants to know if you can call him in something for his nerves to [facility] on [address]. Please advise. Thanks!

## 2014-04-14 NOTE — Telephone Encounter (Signed)
Patient is followed by [facility] for schizophrenia. Recommend that patient follow up with [facility] walk-in clinic M-F from 8am-3pm for issues concerning anxiety. We did not discuss insomnia or increased anxiety during previous appointment, I would need to evaluate patient patient prior to sending a prescription.

## 2014-04-14 NOTE — Telephone Encounter (Signed)
Spoke with Patients wife, she states that patient has an appointment with [facility] tomorrow 04/15/2014 and she will see that this is addressed there. Wife states patient denies feelings of harming his self or others at this time. Thanks!

## 2014-07-17 ENCOUNTER — Encounter: Payer: Medicare Other | Admitting: Internal Medicine

## 2014-07-21 ENCOUNTER — Encounter: Payer: Medicare Other | Admitting: Family Medicine

## 2014-07-24 ENCOUNTER — Encounter (HOSPITAL_COMMUNITY): Payer: Self-pay | Admitting: Cardiovascular Disease

## 2014-12-01 ENCOUNTER — Emergency Department (HOSPITAL_COMMUNITY)
Admission: EM | Admit: 2014-12-01 | Discharge: 2014-12-02 | Disposition: A | Discharge status: Home / Self Care | Payer: Medicare Other | Attending: Emergency Medicine | Admitting: Emergency Medicine

## 2014-12-01 ENCOUNTER — Encounter (HOSPITAL_COMMUNITY): Payer: Self-pay | Admitting: Emergency Medicine

## 2014-12-01 DIAGNOSIS — E780 Pure hypercholesterolemia: Secondary | ICD-10-CM | POA: Insufficient documentation

## 2014-12-01 DIAGNOSIS — Z791 Long term (current) use of non-steroidal anti-inflammatories (NSAID): Secondary | ICD-10-CM | POA: Insufficient documentation

## 2014-12-01 DIAGNOSIS — I10 Essential (primary) hypertension: Secondary | ICD-10-CM | POA: Insufficient documentation

## 2014-12-01 DIAGNOSIS — G479 Sleep disorder, unspecified: Secondary | ICD-10-CM | POA: Insufficient documentation

## 2014-12-01 DIAGNOSIS — Z720 Tobacco use: Secondary | ICD-10-CM | POA: Diagnosis not present

## 2014-12-01 DIAGNOSIS — Z79899 Other long term (current) drug therapy: Secondary | ICD-10-CM | POA: Insufficient documentation

## 2014-12-01 DIAGNOSIS — Z9889 Other specified postprocedural states: Secondary | ICD-10-CM | POA: Insufficient documentation

## 2014-12-01 DIAGNOSIS — R000 Tachycardia, unspecified: Secondary | ICD-10-CM | POA: Diagnosis not present

## 2014-12-01 DIAGNOSIS — R630 Anorexia: Secondary | ICD-10-CM | POA: Diagnosis not present

## 2014-12-01 DIAGNOSIS — F209 Schizophrenia, unspecified: Secondary | ICD-10-CM | POA: Diagnosis present

## 2014-12-01 DIAGNOSIS — R45851 Suicidal ideations: Secondary | ICD-10-CM

## 2014-12-01 DIAGNOSIS — Z7982 Long term (current) use of aspirin: Secondary | ICD-10-CM | POA: Insufficient documentation

## 2014-12-01 LAB — CBC WITH DIFFERENTIAL/PLATELET
BASOS ABS: 0 10*3/uL (ref 0.0–0.1)
Basophils Relative: 0 % (ref 0–1)
EOS ABS: 0.1 10*3/uL (ref 0.0–0.7)
Eosinophils Relative: 1 % (ref 0–5)
HCT: 47.6 % (ref 39.0–52.0)
HEMOGLOBIN: 17.5 g/dL — AB (ref 13.0–17.0)
Lymphocytes Relative: 22 % (ref 12–46)
Lymphs Abs: 3 10*3/uL (ref 0.7–4.0)
MCH: 38.4 pg — ABNORMAL HIGH (ref 26.0–34.0)
MCHC: 36.8 g/dL — AB (ref 30.0–36.0)
MCV: 104.4 fL — ABNORMAL HIGH (ref 78.0–100.0)
Monocytes Absolute: 1.1 10*3/uL — ABNORMAL HIGH (ref 0.1–1.0)
Monocytes Relative: 8 % (ref 3–12)
NEUTROS PCT: 69 % (ref 43–77)
Neutro Abs: 9.3 10*3/uL — ABNORMAL HIGH (ref 1.7–7.7)
Platelets: 243 10*3/uL (ref 150–400)
RBC: 4.56 MIL/uL (ref 4.22–5.81)
RDW: 14.7 % (ref 11.5–15.5)
WBC: 13.5 10*3/uL — ABNORMAL HIGH (ref 4.0–10.5)

## 2014-12-01 LAB — COMPREHENSIVE METABOLIC PANEL
ALBUMIN: 4.9 g/dL (ref 3.5–5.2)
ALK PHOS: 81 U/L (ref 39–117)
ALT: 22 U/L (ref 0–53)
AST: 28 U/L (ref 0–37)
Anion gap: 14 (ref 5–15)
BUN: 14 mg/dL (ref 6–23)
CHLORIDE: 92 mmol/L — AB (ref 96–112)
CO2: 21 mmol/L (ref 19–32)
CREATININE: 0.99 mg/dL (ref 0.50–1.35)
Calcium: 9.7 mg/dL (ref 8.4–10.5)
GFR calc Af Amer: >90 mL/min (ref >90)
GFR calc non Af Amer: >90 mL/min (ref >90)
Glucose, Bld: 138 mg/dL — ABNORMAL HIGH (ref 70–99)
POTASSIUM: 3 mmol/L — AB (ref 3.5–5.1)
SODIUM: 127 mmol/L — AB (ref 135–145)
TOTAL PROTEIN: 8.3 g/dL (ref 6.0–8.3)
Total Bilirubin: 0.5 mg/dL (ref 0.3–1.2)

## 2014-12-01 LAB — I-STAT CHEM 8, ED
BUN: 10 mg/dL (ref 6–23)
CALCIUM ION: 1.08 mmol/L — AB (ref 1.12–1.23)
CHLORIDE: 99 mmol/L (ref 96–112)
Creatinine, Ser: 0.8 mg/dL (ref 0.50–1.35)
Glucose, Bld: 184 mg/dL — ABNORMAL HIGH (ref 70–99)
HEMATOCRIT: 47 % (ref 39.0–52.0)
Hemoglobin: 16 g/dL (ref 13.0–17.0)
Potassium: 2.9 mmol/L — ABNORMAL LOW (ref 3.5–5.1)
Sodium: 133 mmol/L — ABNORMAL LOW (ref 135–145)
TCO2: 18 mmol/L (ref 0–100)

## 2014-12-01 LAB — MAGNESIUM: MAGNESIUM: 1.7 mg/dL (ref 1.5–2.5)

## 2014-12-01 LAB — POTASSIUM: Potassium: 3.3 mmol/L — ABNORMAL LOW (ref 3.5–5.1)

## 2014-12-01 LAB — RAPID URINE DRUG SCREEN, HOSP PERFORMED
AMPHETAMINES: NOT DETECTED
BENZODIAZEPINES: NOT DETECTED
Barbiturates: NOT DETECTED
Cocaine: NOT DETECTED
Opiates: NOT DETECTED
Tetrahydrocannabinol: NOT DETECTED

## 2014-12-01 LAB — ETHANOL: Alcohol, Ethyl (B): <5 mg/dL (ref 0–9)

## 2014-12-01 MED ORDER — POTASSIUM CHLORIDE CRYS ER 20 MEQ PO TBCR
40.0000 meq | EXTENDED_RELEASE_TABLET | Freq: Once | ORAL | Status: AC
  Administered 2014-12-01: 40 meq via ORAL
  Filled 2014-12-01: qty 2

## 2014-12-01 MED ORDER — TIOTROPIUM BROMIDE MONOHYDRATE 18 MCG IN CAPS
18.0000 ug | ORAL_CAPSULE | Freq: Every day | RESPIRATORY_TRACT | Status: DC
  Administered 2014-12-01 – 2014-12-02 (×2): 18 ug via RESPIRATORY_TRACT
  Filled 2014-12-01: qty 5

## 2014-12-01 MED ORDER — ATENOLOL-CHLORTHALIDONE 50-25 MG PO TABS: 1.0000 | ORAL_TABLET | Freq: Every day | ORAL | Status: DC

## 2014-12-01 MED ORDER — TRIHEXYPHENIDYL HCL 2 MG PO TABS
2.0000 mg | ORAL_TABLET | Freq: Three times a day (TID) | ORAL | Status: DC
Start: 2014-12-01 — End: 2014-12-02
  Administered 2014-12-01 – 2014-12-02 (×3): 2 mg via ORAL
  Filled 2014-12-01 (×5): qty 1

## 2014-12-01 MED ORDER — FENOFIBRATE 54 MG PO TABS: 54.0000 mg | ORAL_TABLET | Freq: Every day | ORAL | Status: DC

## 2014-12-01 MED ORDER — MAGNESIUM 250 MG PO TABS: 250.0000 mg | ORAL_TABLET | Freq: Every day | ORAL | Status: DC

## 2014-12-01 MED ORDER — ZOLPIDEM TARTRATE 5 MG PO TABS
5.0000 mg | ORAL_TABLET | Freq: Every evening | ORAL | Status: DC | PRN
  Administered 2014-12-01 – 2014-12-02 (×2): 5 mg via ORAL
  Filled 2014-12-01 (×2): qty 1

## 2014-12-01 MED ORDER — PANTOPRAZOLE SODIUM 40 MG PO TBEC
40.0000 mg | DELAYED_RELEASE_TABLET | Freq: Every day | ORAL | Status: DC
  Administered 2014-12-01 – 2014-12-02 (×2): 40 mg via ORAL
  Filled 2014-12-01 (×2): qty 1

## 2014-12-01 MED ORDER — NICOTINE 21 MG/24HR TD PT24
21.0000 mg | MEDICATED_PATCH | Freq: Every day | TRANSDERMAL | Status: DC
  Administered 2014-12-01: 21 mg via TRANSDERMAL

## 2014-12-01 MED ORDER — GABAPENTIN 300 MG PO CAPS
600.0000 mg | ORAL_CAPSULE | Freq: Three times a day (TID) | ORAL | Status: DC
  Administered 2014-12-01 – 2014-12-02 (×4): 600 mg via ORAL
  Filled 2014-12-01 (×4): qty 2

## 2014-12-01 MED ORDER — NICOTINE 21 MG/24HR TD PT24
21.0000 mg | MEDICATED_PATCH | Freq: Every day | TRANSDERMAL | Status: DC
  Filled 2014-12-01: qty 1

## 2014-12-01 MED ORDER — FENOFIBRATE 160 MG PO TABS
160.0000 mg | ORAL_TABLET | Freq: Every day | ORAL | Status: DC
  Administered 2014-12-01 – 2014-12-02 (×2): 160 mg via ORAL
  Filled 2014-12-01 (×2): qty 1

## 2014-12-01 MED ORDER — CHLORTHALIDONE 25 MG PO TABS
25.0000 mg | ORAL_TABLET | Freq: Every day | ORAL | Status: DC
  Administered 2014-12-01 – 2014-12-02 (×2): 25 mg via ORAL
  Filled 2014-12-01 (×2): qty 1

## 2014-12-01 MED ORDER — MAGNESIUM OXIDE 400 (241.3 MG) MG PO TABS
400.0000 mg | ORAL_TABLET | Freq: Every day | ORAL | Status: DC
  Administered 2014-12-01 – 2014-12-02 (×2): 400 mg via ORAL
  Filled 2014-12-01 (×2): qty 1

## 2014-12-01 MED ORDER — GABAPENTIN 300 MG PO CAPS
300.0000 mg | ORAL_CAPSULE | Freq: Three times a day (TID) | ORAL | Status: DC
  Filled 2014-12-01: qty 1

## 2014-12-01 MED ORDER — POTASSIUM CHLORIDE 10 MEQ/100ML IV SOLN
10.0000 meq | Freq: Once | INTRAVENOUS | Status: AC
  Administered 2014-12-01: 10 meq via INTRAVENOUS
  Filled 2014-12-01: qty 100

## 2014-12-01 MED ORDER — ROSUVASTATIN CALCIUM 40 MG PO TABS
40.0000 mg | ORAL_TABLET | Freq: Every day | ORAL | Status: DC
  Administered 2014-12-01 – 2014-12-02 (×2): 40 mg via ORAL
  Filled 2014-12-01 (×2): qty 1

## 2014-12-01 MED ORDER — MAGNESIUM SULFATE 50 % IJ SOLN
1.0000 g | Freq: Once | INTRAMUSCULAR | Status: DC
  Filled 2014-12-01: qty 2

## 2014-12-01 MED ORDER — ROSUVASTATIN CALCIUM 20 MG PO TABS: 20.0000 mg | ORAL_TABLET | Freq: Every day | ORAL | Status: DC

## 2014-12-01 MED ORDER — CHOLECALCIFEROL 10 MCG (400 UNIT) PO TABS
400.0000 [IU] | ORAL_TABLET | Freq: Two times a day (BID) | ORAL | Status: DC
  Administered 2014-12-01 – 2014-12-02 (×3): 400 [IU] via ORAL
  Filled 2014-12-01 (×4): qty 1

## 2014-12-01 MED ORDER — SODIUM CHLORIDE 0.9 % IV BOLUS (SEPSIS)
2000.0000 mL | Freq: Once | INTRAVENOUS | Status: AC
  Administered 2014-12-01: 2000 mL via INTRAVENOUS

## 2014-12-01 MED ORDER — LORAZEPAM 1 MG PO TABS: 1.0000 mg | ORAL_TABLET | Freq: Three times a day (TID) | ORAL | Status: DC | PRN

## 2014-12-01 MED ORDER — ACETAMINOPHEN 325 MG PO TABS: 650.0000 mg | ORAL_TABLET | ORAL | Status: DC | PRN

## 2014-12-01 MED ORDER — MAGNESIUM SULFATE IN D5W 10-5 MG/ML-% IV SOLN
1.0000 g | INTRAVENOUS | Status: AC
  Administered 2014-12-01: 1 g via INTRAVENOUS
  Filled 2014-12-01: qty 100

## 2014-12-01 MED ORDER — POTASSIUM CHLORIDE CRYS ER 20 MEQ PO TBCR: 40.0000 meq | EXTENDED_RELEASE_TABLET | Freq: Once | ORAL | Status: DC

## 2014-12-01 MED ORDER — ATENOLOL 50 MG PO TABS
50.0000 mg | ORAL_TABLET | Freq: Every day | ORAL | Status: DC
  Administered 2014-12-01 – 2014-12-02 (×2): 50 mg via ORAL
  Filled 2014-12-01 (×2): qty 1

## 2014-12-01 NOTE — ED Notes (Signed)
Bed: [id number] Expected date:  Expected time:  Means of arrival:  Comments: Pt from 41

## 2014-12-01 NOTE — ED Provider Notes (Signed)
CSN: [id number]     Arrival date & time 12/01/14  0759 History   First MD Initiated Contact with Patient 12/01/14 0809     No chief complaint on file.  Chief complaint "I want hurt myself"  (Consider location/radiation/quality/duration/timing/severity/associated sxs/prior Treatment) HPI Patient reports feeling suicidal for the past 3-4 days. He is depressed over his wife's being addicted to pain medications. He has no plan. Nothing makes symptoms better or worse. Other associated symptoms include insomnia for the past 3-4 days, diminished appetite. He did eat noodles yesterday. No other associated symptoms. No treatment prior to coming here. He has not attempted to harm himself. Past Medical History  Diagnosis Date  . High cholesterol   . Hypertension   . Schizophrenia, schizo-affective    Past Surgical History  Procedure Laterality Date  . Left heart catheterization with coronary angiogram N/A 03/12/2014    Procedure: LEFT HEART CATHETERIZATION WITH CORONARY ANGIOGRAM;  Surgeon: Micheline Chapman, MD;  Location: [facility] CATH LAB;  Service: Cardiovascular;  Laterality: N/A;   Family History  Problem Relation Age of Onset  . Cancer Father   . Heart attack Maternal Grandmother   . Heart disease Maternal Grandmother   . Heart attack Maternal Grandfather   . Heart disease Maternal Grandfather   . Heart attack Paternal Grandfather   . Heart disease Paternal Grandfather    History  Substance Use Topics  . Smoking status: Current Every Day Smoker -- 1.00 packs/day for 4 years    Types: Cigarettes  . Smokeless tobacco: Not on file  . Alcohol Use: No    Review of Systems  Constitutional: Positive for appetite change.  HENT: Negative.   Respiratory: Negative.   Cardiovascular: Negative.   Gastrointestinal: Negative.   Musculoskeletal: Negative.   Skin: Negative.   Neurological: Negative.   Psychiatric/Behavioral: Positive for suicidal ideas and sleep disturbance.  All other systems  reviewed and are negative.     Allergies  Review of patient's allergies indicates no known allergies.  Home Medications   Prior to Admission medications   Medication Sig Start Date End Date Taking? Authorizing Provider  aspirin EC 81 MG tablet Take 81 mg by mouth daily.    Historical Provider, MD  atenolol-chlorthalidone (TENORETIC) 50-25 MG per tablet Take 1 tablet by mouth daily.    Historical Provider, MD  Cholecalciferol (VITAMIN D-3) 5000 UNITS TABS Take 5,000 Units by mouth 2 (two) times daily.    Historical Provider, MD  clonazePAM (KLONOPIN) 0.5 MG tablet Take 0.5 mg by mouth at bedtime.     Historical Provider, MD  docusate sodium (COLACE) 100 MG capsule Take 100 mg by mouth daily.    Historical Provider, MD  fenofibrate (TRICOR) 48 MG tablet Take 3 tablets (145 mg total) by mouth daily. 04/10/14   Massie Maroon, FNP  gabapentin (NEURONTIN) 300 MG capsule Take 1 capsule (300 mg total) by mouth 3 (three) times daily. 04/07/14   Massie Maroon, FNP  haloperidol lactate (HALDOL) 5 MG/ML injection Inject 2.5 mg into the muscle every 28 (twenty-eight) days.    Historical Provider, MD  Magnesium 250 MG TABS Take 250 mg by mouth daily.     Historical Provider, MD  naproxen (NAPROSYN) 500 MG tablet Take 500 mg by mouth 2 (two) times daily with a meal.    Historical Provider, MD  nicotine (EQ NICOTINE) 21 mg/24hr patch Place 1 patch (21 mg total) onto the skin daily. 04/07/14   Massie Maroon, FNP  nitroGLYCERIN (NITROSTAT)  0.4 MG SL tablet Place 1 tablet (0.4 mg total) under the tongue every 5 (five) minutes as needed for chest pain. 03/12/14   Ripudeep Jenna Luo, MD  omega-3 acid ethyl esters (LOVAZA) 1 G capsule Take 4 g by mouth every evening.     Historical Provider, MD  pantoprazole (PROTONIX) 40 MG tablet Take 1 tablet (40 mg total) by mouth daily. 03/12/14   Ripudeep Jenna Luo, MD  rosuvastatin (CRESTOR) 20 MG tablet Take 1 tablet (20 mg total) by mouth at bedtime. 04/10/14   Massie Maroon, FNP  tiotropium (SPIRIVA) 18 MCG inhalation capsule Place 18 mcg into inhaler and inhale daily.    Historical Provider, MD   BP 136/73 mmHg  Pulse 105  Temp(Src) 97.6 F (36.4 C) (Oral)  Resp 16  SpO2 96% Physical Exam  Constitutional: He is oriented to person, place, and time. He appears well-developed and well-nourished.  HENT:  Head: Normocephalic and atraumatic.  Eyes: Conjunctivae are normal. Pupils are equal, round, and reactive to light.  Neck: Neck supple. No tracheal deviation present. No thyromegaly present.  Cardiovascular: Regular rhythm.   No murmur heard. Mildly tachycardic. Pulse counted at 100 by me  Pulmonary/Chest: Effort normal and breath sounds normal.  Abdominal: Soft. Bowel sounds are normal. He exhibits no distension. There is no tenderness.  Musculoskeletal: Normal range of motion. He exhibits no edema or tenderness.  Neurological: He is alert and oriented to person, place, and time. No cranial nerve deficit. Coordination normal.  Gait normal  Skin: Skin is warm and dry. No rash noted.  Psychiatric: He has a normal mood and affect.  Nursing note and vitals reviewed.   ED Course  Procedures (including critical care time) Labs Review Labs Reviewed - No data to display  Imaging Review No results found.   EKG Interpretation   Date/Time:  Monday December 01 2014 09:11:59 EDT Ventricular Rate:  105 PR Interval:  128 QRS Duration: 85 QT Interval:  336 QTC Calculation: 444 R Axis:   11 Text Interpretation:  Sinus tachycardia Multiple ventricular premature  complexes LAE, consider biatrial enlargement Repol abnrm suggests  ischemia, inferior leads Baseline wander in lead(s) I II aVR Confirmed by  Ethelda Chick  MD, Ashante Yellin ([id number]) on 12/01/2014 9:17:13 AM       4:50 PM patient resting comfortably after treatment with intravenous hydration, intravenous magnesium and intravenous and oral potassium supplementation. PVCs have resolved. Results for orders  placed or performed during the hospital encounter of 12/01/14  CBC WITH DIFFERENTIAL  Result Value Ref Range   WBC 13.5 (H) 4.0 - 10.5 K/uL   RBC 4.56 4.22 - 5.81 MIL/uL   Hemoglobin 17.5 (H) 13.0 - 17.0 g/dL   HCT 62.9 52.8 - 41.3 %   MCV 104.4 (H) 78.0 - 100.0 fL   MCH 38.4 (H) 26.0 - 34.0 pg   MCHC 36.8 (H) 30.0 - 36.0 g/dL   RDW 24.4 01.0 - 27.2 %   Platelets 243 150 - 400 K/uL   Neutrophils Relative % 69 43 - 77 %   Lymphocytes Relative 22 12 - 46 %   Monocytes Relative 8 3 - 12 %   Eosinophils Relative 1 0 - 5 %   Basophils Relative 0 0 - 1 %   Neutro Abs 9.3 (H) 1.7 - 7.7 K/uL   Lymphs Abs 3.0 0.7 - 4.0 K/uL   Monocytes Absolute 1.1 (H) 0.1 - 1.0 K/uL   Eosinophils Absolute 0.1 0.0 - 0.7 K/uL  Basophils Absolute 0.0 0.0 - 0.1 K/uL   Smear Review MORPHOLOGY UNREMARKABLE   Comprehensive metabolic panel  Result Value Ref Range   Sodium 127 (L) 135 - 145 mmol/L   Potassium 3.0 (L) 3.5 - 5.1 mmol/L   Chloride 92 (L) 96 - 112 mmol/L   CO2 21 19 - 32 mmol/L   Glucose, Bld 138 (H) 70 - 99 mg/dL   BUN 14 6 - 23 mg/dL   Creatinine, Ser 4.090.99 0.50 - 1.35 mg/dL   Calcium 9.7 8.4 - 81.110.5 mg/dL   Total Protein 8.3 6.0 - 8.3 g/dL   Albumin 4.9 3.5 - 5.2 g/dL   AST 28 0 - 37 U/L   ALT 22 0 - 53 U/L   Alkaline Phosphatase 81 39 - 117 U/L   Total Bilirubin 0.5 0.3 - 1.2 mg/dL   GFR calc non Af Amer >90 >90 mL/min   GFR calc Af Amer >90 >90 mL/min   Anion gap 14 5 - 15  Drug screen panel, emergency  Result Value Ref Range   Opiates NONE DETECTED NONE DETECTED   Cocaine NONE DETECTED NONE DETECTED   Benzodiazepines NONE DETECTED NONE DETECTED   Amphetamines NONE DETECTED NONE DETECTED   Tetrahydrocannabinol NONE DETECTED NONE DETECTED   Barbiturates NONE DETECTED NONE DETECTED  Ethanol  Result Value Ref Range   Alcohol, Ethyl (B) <5 0 - 9 mg/dL  Magnesium  Result Value Ref Range   Magnesium 1.7 1.5 - 2.5 mg/dL  Potassium  Result Value Ref Range   Potassium 3.3 (L) 3.5 -  5.1 mmol/L  I-Stat Chem 8, ED  Result Value Ref Range   Sodium 133 (L) 135 - 145 mmol/L   Potassium 2.9 (L) 3.5 - 5.1 mmol/L   Chloride 99 96 - 112 mmol/L   BUN 10 6 - 23 mg/dL   Creatinine, Ser 9.140.80 0.50 - 1.35 mg/dL   Glucose, Bld 782184 (H) 70 - 99 mg/dL   Calcium, Ion 9.561.08 (L) 1.12 - 1.23 mmol/L   TCO2 18 0 - 100 mmol/L   Hemoglobin 16.0 13.0 - 17.0 g/dL   HCT 21.347.0 08.639.0 - 57.852.0 %   No results found.  MDM  Patient to be interviewed by psychiatry. Patient noted to be hyponatremic hypokalemic. He's been noncompliant with all medications. Noted to have PVCs. IV hydration. Potassium and magnesium repletion ordered  At 4:50 PM patient is stable for psychiatric evaluation Final diagnoses:  None   diagnosis #1 suicidal ideation #2hyponatremia #3hypokalemia #4pvcs      Doug SouSam Yazleen Molock, MD 12/01/14 1700

## 2014-12-01 NOTE — ED Notes (Addendum)
Pt brought in by [employer] voluntarily for insomnia and not eating over the past 3 days and thoughts of hurting himself. Pt denies any plan on how he would hurt himself.  Pt also denies HI.  Pt states that his wife is taking pain killers and he is very depressed.

## 2014-12-01 NOTE — ED Notes (Signed)
Patient requested for his wife, Stanton KidneyDebra, phone number to be placed in his chart, since he could not remember the number. The number is as follows [phone].

## 2014-12-01 NOTE — Consult Note (Signed)
[facility] Consult   Reason for Consult:  Suicidal ideations Referring Physician:  EDP Patient Identification: [name] [name] MRN:  [id number] Principal Diagnosis: Schizophrenia, unspecified type Diagnosis:   Patient Active Problem List   Diagnosis Date Noted  . Schizophrenia, unspecified type [F20.9] 12/01/2014    Priority: High  . Suicidal ideations [R45.851] 12/01/2014    Priority: High  . Other malaise and fatigue [R53.81, R53.83] 04/07/2014  . Weight gain [R63.5] 04/07/2014  . History of IBS [Z87.19] 04/07/2014  . Tobacco dependence [F17.200] 04/07/2014  . Bilateral foot pain L5500647, M79.672] 04/07/2014  . Dysphagia, unspecified(787.20) [R13.10] 03/10/2014  . Tobacco abuse [Z72.0] 03/10/2014  . Hypokalemia [E87.6] 03/10/2014  . Chest pain [R07.9] 03/09/2014  . HLD (hyperlipidemia) [E78.5] 03/09/2014  . HTN (hypertension) [I10] 03/09/2014    Total Time spent with patient: 45 minutes  Subjective:   [name] [name] is a 49 y.o. male patient admitted with suicidal ideations and a plan to overdose.  HPI:  The patient presented to the ED with suicidal ideations but had to return to the medical side for electrolyte imbalances.  He states he has not been able to eat and gagging on food.  [name] [name] was living with his mother for the past four days until she kicked him and his wife out for keeping her awake.  Evidently, he has had insomnia and has been pacing at night.  Prior to living with his mother, he and his wife were living with her parents in [place].  He goes to [facility] for his monthly Haldol injections but wants to change to [place] since he now lives in [place].  Denies homicidal ideations, alcohol/drug abuse issues.  His wife came to the ED also for admission. HPI Elements:   Location:  generalized. Quality:  acute. Severity:  severe. Timing:  constant. Duration:  few days. Context:  stressors.  Past Medical History:  Past Medical History  Diagnosis Date  .  High cholesterol   . Hypertension   . Schizophrenia, schizo-affective     Past Surgical History  Procedure Laterality Date  . Left heart catheterization with coronary angiogram N/A 03/12/2014    Procedure: LEFT HEART CATHETERIZATION WITH CORONARY ANGIOGRAM;  Surgeon: Blane Ohara, MD;  Location: [facility] CATH LAB;  Service: Cardiovascular;  Laterality: N/A;   Family History:  Family History  Problem Relation Age of Onset  . Cancer Father   . Heart attack Maternal Grandmother   . Heart disease Maternal Grandmother   . Heart attack Maternal Grandfather   . Heart disease Maternal Grandfather   . Heart attack Paternal Grandfather   . Heart disease Paternal Grandfather    Social History:  History  Alcohol Use No     History  Drug Use No    History   Social History  . Marital Status: Married    Spouse Name: N/A  . Number of Children: N/A  . Years of Education: N/A   Social History Main Topics  . Smoking status: Current Every Day Smoker -- 1.00 packs/day for 4 years    Types: Cigarettes  . Smokeless tobacco: Not on file  . Alcohol Use: No  . Drug Use: No  . Sexual Activity: Not on file   Other Topics Concern  . None   Social History Narrative   Additional Social History:    Pain Medications: SEE MAR Prescriptions: SEE MAR Over the Counter: SEE MAR History of alcohol / drug use?: No history of alcohol / drug abuse  Allergies:  No Known Allergies  Labs:  Results for orders placed or performed during the hospital encounter of 12/01/14 (from the past 48 hour(s))  Drug screen panel, emergency     Status: None   Collection Time: 12/01/14  8:27 AM  Result Value Ref Range   Opiates NONE DETECTED NONE DETECTED   Cocaine NONE DETECTED NONE DETECTED   Benzodiazepines NONE DETECTED NONE DETECTED   Amphetamines NONE DETECTED NONE DETECTED   Tetrahydrocannabinol NONE DETECTED NONE DETECTED   Barbiturates NONE DETECTED NONE DETECTED    Comment:         DRUG SCREEN FOR MEDICAL PURPOSES ONLY.  IF CONFIRMATION IS NEEDED FOR ANY PURPOSE, NOTIFY LAB WITHIN 5 DAYS.        LOWEST DETECTABLE LIMITS FOR URINE DRUG SCREEN Drug Class       Cutoff (ng/mL) Amphetamine      1000 Barbiturate      200 Benzodiazepine   628 Tricyclics       315 Opiates          300 Cocaine          300 THC              50   CBC WITH DIFFERENTIAL     Status: Abnormal   Collection Time: 12/01/14  8:35 AM  Result Value Ref Range   WBC 13.5 (H) 4.0 - 10.5 K/uL   RBC 4.56 4.22 - 5.81 MIL/uL   Hemoglobin 17.5 (H) 13.0 - 17.0 g/dL   HCT 47.6 39.0 - 52.0 %   MCV 104.4 (H) 78.0 - 100.0 fL   MCH 38.4 (H) 26.0 - 34.0 pg   MCHC 36.8 (H) 30.0 - 36.0 g/dL    Comment: CORRECTED FOR INTERFERING SUBSTANCE   RDW 14.7 11.5 - 15.5 %   Platelets 243 150 - 400 K/uL   Neutrophils Relative % 69 43 - 77 %   Lymphocytes Relative 22 12 - 46 %   Monocytes Relative 8 3 - 12 %   Eosinophils Relative 1 0 - 5 %   Basophils Relative 0 0 - 1 %   Neutro Abs 9.3 (H) 1.7 - 7.7 K/uL   Lymphs Abs 3.0 0.7 - 4.0 K/uL   Monocytes Absolute 1.1 (H) 0.1 - 1.0 K/uL   Eosinophils Absolute 0.1 0.0 - 0.7 K/uL   Basophils Absolute 0.0 0.0 - 0.1 K/uL   Smear Review MORPHOLOGY UNREMARKABLE   Comprehensive metabolic panel     Status: Abnormal   Collection Time: 12/01/14  8:35 AM  Result Value Ref Range   Sodium 127 (L) 135 - 145 mmol/L   Potassium 3.0 (L) 3.5 - 5.1 mmol/L   Chloride 92 (L) 96 - 112 mmol/L   CO2 21 19 - 32 mmol/L   Glucose, Bld 138 (H) 70 - 99 mg/dL   BUN 14 6 - 23 mg/dL   Creatinine, Ser 0.99 0.50 - 1.35 mg/dL   Calcium 9.7 8.4 - 10.5 mg/dL   Total Protein 8.3 6.0 - 8.3 g/dL   Albumin 4.9 3.5 - 5.2 g/dL   AST 28 0 - 37 U/L   ALT 22 0 - 53 U/L   Alkaline Phosphatase 81 39 - 117 U/L   Total Bilirubin 0.5 0.3 - 1.2 mg/dL   GFR calc non Af Amer >90 >90 mL/min   GFR calc Af Amer >90 >90 mL/min    Comment: (NOTE) The eGFR has been calculated using the CKD EPI equation. This  calculation has  not been validated in all clinical situations. eGFR's persistently <90 mL/min signify possible Chronic Kidney Disease.    Anion gap 14 5 - 15  Ethanol     Status: None   Collection Time: 12/01/14  8:35 AM  Result Value Ref Range   Alcohol, Ethyl (B) <5 0 - 9 mg/dL    Comment:        LOWEST DETECTABLE LIMIT FOR SERUM ALCOHOL IS 11 mg/dL FOR MEDICAL PURPOSES ONLY   Magnesium     Status: None   Collection Time: 12/01/14 10:20 AM  Result Value Ref Range   Magnesium 1.7 1.5 - 2.5 mg/dL    Comment: MODERATE HEMOLYSIS  I-Stat Chem 8, ED     Status: Abnormal   Collection Time: 12/01/14  1:38 PM  Result Value Ref Range   Sodium 133 (L) 135 - 145 mmol/L   Potassium 2.9 (L) 3.5 - 5.1 mmol/L   Chloride 99 96 - 112 mmol/L   BUN 10 6 - 23 mg/dL   Creatinine, Ser 0.80 0.50 - 1.35 mg/dL   Glucose, Bld 184 (H) 70 - 99 mg/dL   Calcium, Ion 1.08 (L) 1.12 - 1.23 mmol/L   TCO2 18 0 - 100 mmol/L   Hemoglobin 16.0 13.0 - 17.0 g/dL   HCT 47.0 39.0 - 52.0 %    Vitals: Blood pressure 121/69, pulse 86, temperature 97.9 F (36.6 C), temperature source Oral, resp. rate 20, SpO2 96 %.  Risk to Self: Suicidal Ideation: Yes-Currently Present Suicidal Intent: Yes-Currently Present Is patient at risk for suicide?: Yes Suicidal Plan?: No Access to Means: No What has been your use of drugs/alcohol within the last 12 months?:  (patient denies ) How many times?:  (0) Other Self Harm Risks:  (n/a) Triggers for Past Attempts: Other (Comment) (no previous attempts or gestures ) Intentional Self Injurious Behavior: None Risk to Others: Homicidal Ideation: No Thoughts of Harm to Others: No Current Homicidal Intent: No Current Homicidal Plan: No Access to Homicidal Means: No Identified Victim:  (n/a) History of harm to others?: No Assessment of Violence: None Noted Violent Behavior Description:  (patient calm and cooperative; "very anxious") Does patient have access to weapons?:  No Criminal Charges Pending?: No Does patient have a court date: No Prior Inpatient Therapy: Prior Inpatient Therapy: No Prior Therapy Dates:  (n/a) Prior Therapy Facilty/Provider(s):  (n/a) Reason for Treatment:  (n/a) Prior Outpatient Therapy: Prior Outpatient Therapy: Yes Prior Therapy Dates:  (current ) Prior Therapy Facilty/Provider(s):  ("Dr[facility] Loni Muse at [facility]") Reason for Treatment:  (medication management )  Current Facility-Administered Medications  Medication Dose Route Frequency Provider Last Rate Last Dose  . acetaminophen (TYLENOL) tablet 650 mg  650 mg Oral Q4H PRN Orlie Dakin, MD      . atenolol (TENORMIN) tablet 50 mg  50 mg Oral Daily Orlie Dakin, MD   50 mg at 12/01/14 1258   And  . chlorthalidone (HYGROTON) tablet 25 mg  25 mg Oral Daily Orlie Dakin, MD   25 mg at 12/01/14 1300  . cholecalciferol (VITAMIN D) tablet 400 Units  400 Units Oral BID Orlie Dakin, MD   400 Units at 12/01/14 1258  . fenofibrate tablet 160 mg  160 mg Oral Daily Orlie Dakin, MD   160 mg at 12/01/14 1257  . gabapentin (NEURONTIN) capsule 600 mg  600 mg Oral TID Orlie Dakin, MD   600 mg at 12/01/14 1258  . magnesium oxide (MAG-OX) tablet 400 mg  400 mg Oral Daily Terri L  Green, RPH   400 mg at 12/01/14 1256  . nicotine (NICODERM CQ - dosed in mg/24 hours) patch 21 mg  21 mg Transdermal Daily Orlie Dakin, MD   21 mg at 12/01/14 0941  . nicotine (NICODERM CQ - dosed in mg/24 hours) patch 21 mg  21 mg Transdermal Daily Orlie Dakin, MD   21 mg at 12/01/14 1255  . pantoprazole (PROTONIX) EC tablet 40 mg  40 mg Oral Daily Orlie Dakin, MD   40 mg at 12/01/14 1256  . rosuvastatin (CRESTOR) tablet 40 mg  40 mg Oral Daily Orlie Dakin, MD   40 mg at 12/01/14 1301  . tiotropium (SPIRIVA) inhalation capsule 18 mcg  18 mcg Inhalation Daily Orlie Dakin, MD   18 mcg at 12/01/14 1311   Current Outpatient Prescriptions  Medication Sig Dispense Refill  . atenolol-chlorthalidone (TENORETIC)  50-25 MG per tablet Take 1 tablet by mouth daily.    . Cholecalciferol (VITAMIN D PO) Take 1 capsule by mouth 2 (two) times daily.    . fenofibrate 160 MG tablet Take 160 mg by mouth daily.     Marland Kitchen gabapentin (NEURONTIN) 600 MG tablet Take 600 mg by mouth 3 (three) times daily.    . haloperidol decanoate (HALDOL DECANOATE) 100 MG/ML injection Take 100 mg by mouth every 28 (twenty-eight) days.     . Magnesium 250 MG TABS Take 250 mg by mouth daily.     . naproxen (NAPROSYN) 500 MG tablet Take 500 mg by mouth 2 (two) times daily with a meal.    . nitroGLYCERIN (NITROSTAT) 0.4 MG SL tablet Place 1 tablet (0.4 mg total) under the tongue every 5 (five) minutes as needed for chest pain. 30 tablet 12  . rosuvastatin (CRESTOR) 40 MG tablet Take 40 mg by mouth daily.    Marland Kitchen tiotropium (SPIRIVA) 18 MCG inhalation capsule Place 18 mcg into inhaler and inhale daily.    . fenofibrate (TRICOR) 48 MG tablet Take 3 tablets (145 mg total) by mouth daily. (Patient not taking: Reported on 12/01/2014) 30 tablet 2  . gabapentin (NEURONTIN) 300 MG capsule Take 1 capsule (300 mg total) by mouth 3 (three) times daily. (Patient not taking: Reported on 12/01/2014) 120 capsule 1  . nicotine (EQ NICOTINE) 21 mg/24hr patch Place 1 patch (21 mg total) onto the skin daily. (Patient not taking: Reported on 12/01/2014) 28 patch 0  . pantoprazole (PROTONIX) 40 MG tablet Take 1 tablet (40 mg total) by mouth daily. (Patient not taking: Reported on 12/01/2014) 30 tablet 4  . rosuvastatin (CRESTOR) 20 MG tablet Take 1 tablet (20 mg total) by mouth at bedtime. (Patient not taking: Reported on 12/01/2014) 30 tablet 2    Musculoskeletal: Strength & Muscle Tone: within normal limits Gait & Station: normal Patient leans: N/A  Psychiatric Specialty Exam:     Blood pressure 121/69, pulse 86, temperature 97.9 F (36.6 C), temperature source Oral, resp. rate 20, SpO2 96 %.There is no weight on file to calculate BMI.  General Appearance:  Disheveled  Eye Sport and exercise psychologist::  Fair  Speech:  Normal Rate  Volume:  Normal  Mood:  Depressed  Affect:  Congruent  Thought Process:  Coherent  Orientation:  Full (Time, Place, and Person)  Thought Content:  WDL  Suicidal Thoughts:  Yes.  with intent/plan  Homicidal Thoughts:  No  Memory:  Immediate;   Fair Recent;   Fair Remote;   Fair  Judgement:  Poor  Insight:  Fair  Psychomotor Activity:  Decreased  Concentration:  Fair  Recall:  [employer] of Knowledge:Fair  Language: Fair  Akathisia:  No  Handed:  Right  AIMS (if indicated):     Assets:  Leisure Time Physical Health Resilience  ADL's:  Intact  Cognition: WNL  Sleep:      Medical Decision Making: Review of Psycho-Social Stressors (1), Review or order clinical lab tests (1) and Review of Medication Regimen & Side Effects (2)  Treatment Plan Summary: Daily contact with patient to assess and evaluate symptoms and progress in treatment, Medication management and Plan admit to inpatient psychiatric unit for stabilization  Plan:  Recommend psychiatric Inpatient admission when medically cleared. Disposition: [name] [name], PMH-NP 12/01/2014 3:23 PM Patient seen face-to-face for psychiatric evaluation, chart reviewed and case discussed with the physician extender and developed treatment plan. Reviewed the information documented and agree with the treatment plan. Corena Pilgrim, MD

## 2014-12-01 NOTE — BH Assessment (Signed)
Assessment Note  [name] [name] is an 49 y.o. male with history of Schizophrenia presents from [facility] via [facility]. Patient stating that [facility] didn't have any beds so he was sent here. Patient is here at [facility] voluntarily. Writer meets with patient to complete a face to face [facility] assessment. Patient stating that he is suicidal. Onset of suicidal thoughts 2-3 days ago. The suicidal thoughts are triggered by recent changes such as living situation and spouse on pain killers. Sts that previously he was living in a home with his spouse and her family. Sts that they were not getting along, therefore; patient and spouse moved in with his mother. Since patient and spouse moved in with his spouse he feels increasing depressed evidenced by loss of interest in usual pleasures, irritability, fatigue, and hopelessness. Patient repeats many times during today's assessment, "I am so depressed and I have strong urges to hurt myself". Patient asking this Clinical research associate many times, "I need to be kept here a couple of days". He has no suicidal plan. Patient unable to contract for safety. He denies history of self mutilating behaviors. He denies HI and AVH's. Although patient does not have any current psychotic symptoms he admits to a history of hearing voices (last 20 yrs ago). No alcohol or drug use. Patient reports several inpatient mental health hospitalizations including: [facility] and [facility]. He has a current outpatient psychiatrist "Dr. Mervyn Skeeters" at [facility] in [facility].   Axis I: Major Depressive Disorder, Recurrent, Severe, with psychotic features Axis II: Deferred Axis III:  Past Medical History  Diagnosis Date  . High cholesterol   . Hypertension   . Schizophrenia, schizo-affective    Axis IV: housing problems, other psychosocial or environmental problems, problems related to social environment, problems with access to health care services and problems with primary support group Axis V: 31-40 impairment in reality  testing  Past Medical History:  Past Medical History  Diagnosis Date  . High cholesterol   . Hypertension   . Schizophrenia, schizo-affective     Past Surgical History  Procedure Laterality Date  . Left heart catheterization with coronary angiogram N/A 03/12/2014    Procedure: LEFT HEART CATHETERIZATION WITH CORONARY ANGIOGRAM;  Surgeon: Micheline Chapman, MD;  Location: [facility] CATH LAB;  Service: Cardiovascular;  Laterality: N/A;    Family History:  Family History  Problem Relation Age of Onset  . Cancer Father   . Heart attack Maternal Grandmother   . Heart disease Maternal Grandmother   . Heart attack Maternal Grandfather   . Heart disease Maternal Grandfather   . Heart attack Paternal Grandfather   . Heart disease Paternal Grandfather     Social History:  reports that he has been smoking Cigarettes.  He has a 4 pack-year smoking history. He does not have any smokeless tobacco history on file. He reports that he does not drink alcohol or use illicit drugs.  Additional Social History:  Alcohol / Drug Use Pain Medications: SEE MAR Prescriptions: SEE MAR Over the Counter: SEE MAR History of alcohol / drug use?: No history of alcohol / drug abuse  CIWA: CIWA-Ar BP: 136/73 mmHg Pulse Rate: 105 COWS:    Allergies: No Known Allergies  Home Medications:  (Not in a hospital admission)  OB/GYN Status:  No LMP for male patient.  General Assessment Data Location of Assessment: [facility] ED Is this a Tele or Face-to-Face Assessment?: Face-to-Face Is this an Initial Assessment or a Re-assessment for this encounter?: Initial Assessment Living Arrangements:  Other (Comment), Spouse/significant other, Parent, Other relatives (patient lives with mother, spouse, and sister) Can pt return to current living arrangement?: Yes Admission Status: Voluntary Is patient capable of signing voluntary admission?: Yes Transfer from: Acute Hospital Referral Source: Self/Family/Friend  Medical  Screening Exam [facility] Walk-in ONLY) Medical Exam completed: Yes  [facility] Crisis Care Plan Living Arrangements: Other (Comment), Spouse/significant other, Parent, Other relatives (patient lives with mother, spouse, and sister) Name of Psychiatrist:  ("Dr. Mervyn Skeeters from [facility]") Name of Therapist:  (none reported )  Education Status Is patient currently in school?: No Highest grade of school patient has completed:  (8th grade )  Risk to self with the past 6 months Suicidal Ideation: Yes-Currently Present Suicidal Intent: Yes-Currently Present Is patient at risk for suicide?: Yes Suicidal Plan?: No Access to Means: No What has been your use of drugs/alcohol within the last 12 months?:  (patient denies ) Previous Attempts/Gestures: No How many times?:  (0) Other Self Harm Risks:  (n/a) Triggers for Past Attempts: Other (Comment) (no previous attempts or gestures ) Intentional Self Injurious Behavior: None Family Suicide History: Yes Recent stressful life event(s): Other (Comment) ("Spouse on pain killers and moved back in w/ mother") Persecutory voices/beliefs?: No Depression: Yes Depression Symptoms: Feeling angry/irritable, Loss of interest in usual pleasures, Feeling worthless/self pity, Guilt, Isolating, Fatigue, Tearfulness, Insomnia, Despondent Substance abuse history and/or treatment for substance abuse?: No Suicide prevention information given to non-admitted patients: Not applicable  Risk to Others within the past 6 months Homicidal Ideation: No Thoughts of Harm to Others: No Current Homicidal Intent: No Current Homicidal Plan: No Access to Homicidal Means: No Identified Victim:  (n/a) History of harm to others?: No Assessment of Violence: None Noted Violent Behavior Description:  (patient calm and cooperative; "very anxious") Does patient have access to weapons?: No Criminal Charges Pending?: No Does patient have a court date: No  Psychosis Hallucinations: None noted (no current  symptoms; has a hx of hearing voices (20 yrs ago)) Delusions: None noted  Mental Status Report Appearance/Hygiene: Disheveled Eye Contact: Good Motor Activity: Freedom of movement Speech: Logical/coherent Level of Consciousness: Alert Mood: Depressed Affect: Angry Anxiety Level: Severe Thought Processes: Coherent, Relevant Judgement: Impaired Orientation: Person, Place, Time, Situation Obsessive Compulsive Thoughts/Behaviors: None  Cognitive Functioning Concentration: Decreased Memory: Recent Intact, Remote Intact IQ: Average Insight: Poor Impulse Control: Poor Appetite: Poor Weight Loss:  (5-6 pounds in 3 days ) Weight Gain:  (none reported ) Sleep: No Change Total Hours of Sleep:  (0) Vegetative Symptoms: None  ADLScreening [facility] Assessment Services) Patient's cognitive ability adequate to safely complete daily activities?: Yes Patient able to express need for assistance with ADLs?: Yes Independently performs ADLs?: Yes (appropriate for developmental age)  Prior Inpatient Therapy Prior Inpatient Therapy: No Prior Therapy Dates:  (n/a) Prior Therapy Facilty/Provider(s):  (n/a) Reason for Treatment:  (n/a)  Prior Outpatient Therapy Prior Outpatient Therapy: Yes Prior Therapy Dates:  (current ) Prior Therapy Facilty/Provider(s):  ("[facility] at [facility]") Reason for Treatment:  (medication management )  ADL Screening (condition at time of admission) Patient's cognitive ability adequate to safely complete daily activities?: Yes Is the patient deaf or have difficulty hearing?: No Does the patient have difficulty seeing, even when wearing glasses/contacts?:  ("I wear glassess when I drive") Does the patient have difficulty concentrating, remembering, or making decisions?: No Patient able to express need for assistance with ADLs?: Yes Does the patient have difficulty dressing or bathing?: No Independently performs ADLs?: Yes (appropriate for developmental age) Does the  patient  have difficulty walking or climbing stairs?: No Weakness of Legs: None Weakness of Arms/Hands: None  Home Assistive Devices/Equipment Home Assistive Devices/Equipment: None    Abuse/Neglect Assessment (Assessment to be complete while patient is alone) Physical Abuse: Denies Verbal Abuse: Denies Sexual Abuse: Denies Exploitation of patient/patient's resources: Denies     Advance Directives (For Healthcare) Does patient have an advance directive?: No Would patient like information on creating an advanced directive?: No - patient declined information    Additional Information 1:1 In Past 12 Months?: No CIRT Risk: No Elopement Risk: No Does patient have medical clearance?: Yes     Disposition:  Disposition Initial Assessment Completed for this Encounter: Yes Disposition of Patient: Other dispositions (Disposition pending psychiatric evaluation )  On Site Evaluation by:   Reviewed with Physician:    Octaviano BattyPerry, Doniqua Saxby Mona 12/01/2014 9:10 AM

## 2014-12-02 DIAGNOSIS — F209 Schizophrenia, unspecified: Secondary | ICD-10-CM

## 2014-12-02 DIAGNOSIS — R45851 Suicidal ideations: Secondary | ICD-10-CM | POA: Insufficient documentation

## 2014-12-02 MED ORDER — TRIHEXYPHENIDYL HCL 2 MG PO TABS: 2.0000 mg | ORAL_TABLET | Freq: Three times a day (TID) | ORAL | Status: DC

## 2014-12-02 NOTE — [facility] Suicide Risk Assessment (Cosign Needed)
Suicide Risk Assessment  Discharge Assessment   [facility]   Demographic Factors:  Male, Caucasian, Low socioeconomic status and Unemployed  Total Time spent with patient: 20 minutes  Musculoskeletal: Strength & Muscle Tone: within normal limits Gait & Station: normal Patient leans: N/A  Psychiatric Specialty Exam:     Blood pressure 124/69, pulse 82, temperature 97.2 F (36.2 C), temperature source Oral, resp. rate 16, SpO2 96 %.There is no weight on file to calculate BMI.  General Appearance: Casual and Disheveled  Eye Contact::  Good  Speech:  Clear and Coherent and Normal Rate409  Volume:  Normal  Mood:  Anxious and Depressed  Affect:  Congruent  Thought Process:  Coherent, Goal Directed and Intact  Orientation:  Full (Time, Place, and Person)  Thought Content:  WDL  Suicidal Thoughts:  No  Homicidal Thoughts:  No  Memory:  Immediate;   Good Recent;   Good Remote;   Good  Judgement:  Fair  Insight:  Fair  Psychomotor Activity:  Normal  Concentration:  Good  Recall:  NA  Fund of Knowledge:Fair  Language: Good  Akathisia:  NA  Handed:  Right  AIMS (if indicated):     Assets:  Desire for Improvement  Sleep:     Cognition: WNL  ADL's:  Impaired      Has this patient used any form of tobacco in the last 30 days? (Cigarettes, Smokeless Tobacco, Cigars, and/or Pipes) Yes, A prescription for an FDA-approved tobacco cessation medication was offered at discharge and the patient refused  Mental Status Per Nursing Assessment::   On Admission:     Current Mental Status by Physician: NA  Loss Factors: NA  Historical Factors: NA  Risk Reduction Factors:   Sense of responsibility to family, Religious beliefs about death, Living with another person, especially a relative and Positive therapeutic relationship  Continued Clinical Symptoms:  Depression:   Insomnia Schizophrenia:   Paranoid or undifferentiated type  Cognitive Features  That Contribute To Risk:  Polarized thinking    Suicide Risk:  Minimal: No identifiable suicidal ideation.  Patients presenting with no risk factors but with morbid ruminations; may be classified as minimal risk based on the severity of the depressive symptoms  Principal Problem: Schizophrenia, unspecified type Discharge Diagnoses:  Patient Active Problem List   Diagnosis Date Noted  . Suicidal ideation [R45.851]   . Schizophrenia, unspecified type [F20.9] 12/01/2014  . Suicidal ideations [R45.851] 12/01/2014  . Other malaise and fatigue [R53.81, R53.83] 04/07/2014  . Weight gain [R63.5] 04/07/2014  . History of IBS [Z87.19] 04/07/2014  . Tobacco dependence [F17.200] 04/07/2014  . Bilateral foot pain [id number][M79.671, M79.672] 04/07/2014  . Dysphagia, unspecified(787.20) [R13.10] 03/10/2014  . Tobacco abuse [Z72.0] 03/10/2014  . Hypokalemia [E87.6] 03/10/2014  . Chest pain [R07.9] 03/09/2014  . HLD (hyperlipidemia) [E78.5] 03/09/2014  . HTN (hypertension) [I10] 03/09/2014      Plan Of Care/Follow-up recommendations:  Activity:  as tolerated Diet:  regular  Is patient on multiple antipsychotic therapies at discharge:  No   Has Patient had three or more failed trials of antipsychotic monotherapy by history:  No  Recommended Plan for Multiple Antipsychotic Therapies: NA    Sumedha Munnerlyn C    PMHNP-BC 12/02/2014, 3:10 PM

## 2014-12-02 NOTE — Discharge Instructions (Signed)
For your ongoing mental health needs, you are advised to follow up with [facility].  New and returning patients are seen at their walk-in clinic.  Walk-in hours are Monday - Friday from 8:00 am - 3:00 pm.  Walk-in patients are seen on a first come, first served basis.  Try to arrive as early as possible for he best chance of being seen the same day: ° °     [facility] °     [address] °     [place], [place] [address] °     [phone] °

## 2014-12-02 NOTE — Consult Note (Signed)
[facility] Consult   Reason for Consult:  Suicidal ideations Referring Physician:  EDP Patient Identification: [name] [name] MRN:  [id number] Principal Diagnosis: Schizophrenia, unspecified type Diagnosis:   Patient Active Problem List   Diagnosis Date Noted  . Schizophrenia, unspecified type [F20.9] 12/01/2014  . Suicidal ideations [R45.851] 12/01/2014  . Other malaise and fatigue [R53.81, R53.83] 04/07/2014  . Weight gain [R63.5] 04/07/2014  . History of IBS [Z87.19] 04/07/2014  . Tobacco dependence [F17.200] 04/07/2014  . Bilateral foot pain L5500647, M79.672] 04/07/2014  . Dysphagia, unspecified(787.20) [R13.10] 03/10/2014  . Tobacco abuse [Z72.0] 03/10/2014  . Hypokalemia [E87.6] 03/10/2014  . Chest pain [R07.9] 03/09/2014  . HLD (hyperlipidemia) [E78.5] 03/09/2014  . HTN (hypertension) [I10] 03/09/2014    Total Time spent with patient: 45 minutes  Subjective:   [name] [name] is a 49 y.o. male patient admitted with suicidal ideations and a plan to overdose.  HPI:  The patient presented to the ED with suicidal ideations but had to return to the medical side for electrolyte imbalances.  He states he has not been able to eat and gagging on food.  [place] was living with his mother for the past four days until she kicked him and his wife out for keeping her awake.  Evidently, he has had insomnia and has been pacing at night.  Prior to living with his mother, he and his wife were living with her parents in [place].  He goes to [facility] for his monthly Haldol injections but wants to change to [facility] since he now lives in [place].  Denies homicidal ideations, alcohol/drug abuse issues.  His wife came to the ED also for admission.  12/02/2014: Reviewed above note with update today.  Patient reports good night sleep and stated that he took Artane with good result, less extrapyramidal effect of his medications.  He is not pacing the hall way and has been in his room.  He was  smiling during our encounter.  Patient denies SI/HI/AVH.  Patient will be discharged with prescription for Artane.  Patient will follow up with [facility].  HPI Elements:   Location:  generalized. Quality:  acute. Severity:  severe. Timing:  constant. Duration:  few days. Context:  stressors.  Past Medical History:  Past Medical History  Diagnosis Date  . High cholesterol   . Hypertension   . Schizophrenia, schizo-affective     Past Surgical History  Procedure Laterality Date  . Left heart catheterization with coronary angiogram N/A 03/12/2014    Procedure: LEFT HEART CATHETERIZATION WITH CORONARY ANGIOGRAM;  Surgeon: Blane Ohara, MD;  Location: [facility] CATH LAB;  Service: Cardiovascular;  Laterality: N/A;   Family History:  Family History  Problem Relation Age of Onset  . Cancer Father   . Heart attack Maternal Grandmother   . Heart disease Maternal Grandmother   . Heart attack Maternal Grandfather   . Heart disease Maternal Grandfather   . Heart attack Paternal Grandfather   . Heart disease Paternal Grandfather    Social History:  History  Alcohol Use No     History  Drug Use No    History   Social History  . Marital Status: Married    Spouse Name: N/A  . Number of Children: N/A  . Years of Education: N/A   Social History Main Topics  . Smoking status: Current Every Day Smoker -- 1.00 packs/day for 4 years    Types: Cigarettes  . Smokeless tobacco: Not on file  . Alcohol Use: No  .  Drug Use: No  . Sexual Activity: Not on file   Other Topics Concern  . None   Social History Narrative   Additional Social History:    Pain Medications: SEE MAR Prescriptions: SEE MAR Over the Counter: SEE MAR History of alcohol / drug use?: No history of alcohol / drug abuse   Allergies:  No Known Allergies  Labs:  Results for orders placed or performed during the hospital encounter of 12/01/14 (from the past 48 hour(s))  Drug screen panel, emergency     Status: None    Collection Time: 12/01/14  8:27 AM  Result Value Ref Range   Opiates NONE DETECTED NONE DETECTED   Cocaine NONE DETECTED NONE DETECTED   Benzodiazepines NONE DETECTED NONE DETECTED   Amphetamines NONE DETECTED NONE DETECTED   Tetrahydrocannabinol NONE DETECTED NONE DETECTED   Barbiturates NONE DETECTED NONE DETECTED    Comment:        DRUG SCREEN FOR MEDICAL PURPOSES ONLY.  IF CONFIRMATION IS NEEDED FOR ANY PURPOSE, NOTIFY LAB WITHIN 5 DAYS.        LOWEST DETECTABLE LIMITS FOR URINE DRUG SCREEN Drug Class       Cutoff (ng/mL) Amphetamine      1000 Barbiturate      200 Benzodiazepine   110 Tricyclics       315 Opiates          300 Cocaine          300 THC              50   CBC WITH DIFFERENTIAL     Status: Abnormal   Collection Time: 12/01/14  8:35 AM  Result Value Ref Range   WBC 13.5 (H) 4.0 - 10.5 K/uL   RBC 4.56 4.22 - 5.81 MIL/uL   Hemoglobin 17.5 (H) 13.0 - 17.0 g/dL   HCT 47.6 39.0 - 52.0 %   MCV 104.4 (H) 78.0 - 100.0 fL   MCH 38.4 (H) 26.0 - 34.0 pg   MCHC 36.8 (H) 30.0 - 36.0 g/dL    Comment: CORRECTED FOR INTERFERING SUBSTANCE   RDW 14.7 11.5 - 15.5 %   Platelets 243 150 - 400 K/uL   Neutrophils Relative % 69 43 - 77 %   Lymphocytes Relative 22 12 - 46 %   Monocytes Relative 8 3 - 12 %   Eosinophils Relative 1 0 - 5 %   Basophils Relative 0 0 - 1 %   Neutro Abs 9.3 (H) 1.7 - 7.7 K/uL   Lymphs Abs 3.0 0.7 - 4.0 K/uL   Monocytes Absolute 1.1 (H) 0.1 - 1.0 K/uL   Eosinophils Absolute 0.1 0.0 - 0.7 K/uL   Basophils Absolute 0.0 0.0 - 0.1 K/uL   Smear Review MORPHOLOGY UNREMARKABLE   Comprehensive metabolic panel     Status: Abnormal   Collection Time: 12/01/14  8:35 AM  Result Value Ref Range   Sodium 127 (L) 135 - 145 mmol/L   Potassium 3.0 (L) 3.5 - 5.1 mmol/L   Chloride 92 (L) 96 - 112 mmol/L   CO2 21 19 - 32 mmol/L   Glucose, Bld 138 (H) 70 - 99 mg/dL   BUN 14 6 - 23 mg/dL   Creatinine, Ser 0.99 0.50 - 1.35 mg/dL   Calcium 9.7 8.4 - 10.5 mg/dL    Total Protein 8.3 6.0 - 8.3 g/dL   Albumin 4.9 3.5 - 5.2 g/dL   AST 28 0 - 37 U/L   ALT 22 0 -  53 U/L   Alkaline Phosphatase 81 39 - 117 U/L   Total Bilirubin 0.5 0.3 - 1.2 mg/dL   GFR calc non Af Amer >90 >90 mL/min   GFR calc Af Amer >90 >90 mL/min    Comment: (NOTE) The eGFR has been calculated using the CKD EPI equation. This calculation has not been validated in all clinical situations. eGFR's persistently <90 mL/min signify possible Chronic Kidney Disease.    Anion gap 14 5 - 15  Ethanol     Status: None   Collection Time: 12/01/14  8:35 AM  Result Value Ref Range   Alcohol, Ethyl (B) <5 0 - 9 mg/dL    Comment:        LOWEST DETECTABLE LIMIT FOR SERUM ALCOHOL IS 11 mg/dL FOR MEDICAL PURPOSES ONLY   Magnesium     Status: None   Collection Time: 12/01/14 10:20 AM  Result Value Ref Range   Magnesium 1.7 1.5 - 2.5 mg/dL    Comment: MODERATE HEMOLYSIS  I-Stat Chem 8, ED     Status: Abnormal   Collection Time: 12/01/14  1:38 PM  Result Value Ref Range   Sodium 133 (L) 135 - 145 mmol/L   Potassium 2.9 (L) 3.5 - 5.1 mmol/L   Chloride 99 96 - 112 mmol/L   BUN 10 6 - 23 mg/dL   Creatinine, Ser 0.80 0.50 - 1.35 mg/dL   Glucose, Bld 184 (H) 70 - 99 mg/dL   Calcium, Ion 1.08 (L) 1.12 - 1.23 mmol/L   TCO2 18 0 - 100 mmol/L   Hemoglobin 16.0 13.0 - 17.0 g/dL   HCT 47.0 39.0 - 52.0 %  Potassium     Status: Abnormal   Collection Time: 12/01/14  3:40 PM  Result Value Ref Range   Potassium 3.3 (L) 3.5 - 5.1 mmol/L    Vitals: Blood pressure 124/69, pulse 82, temperature 97.2 F (36.2 C), temperature source Oral, resp. rate 16, SpO2 96 %.  Risk to Self: Suicidal Ideation: Yes-Currently Present Suicidal Intent: Yes-Currently Present Is patient at risk for suicide?: Yes Suicidal Plan?: No Access to Means: No What has been your use of drugs/alcohol within the last 12 months?:  (patient denies ) How many times?:  (0) Other Self Harm Risks:  (n/a) Triggers for Past Attempts:  Other (Comment) (no previous attempts or gestures ) Intentional Self Injurious Behavior: None Risk to Others: Homicidal Ideation: No Thoughts of Harm to Others: No Current Homicidal Intent: No Current Homicidal Plan: No Access to Homicidal Means: No Identified Victim:  (n/a) History of harm to others?: No Assessment of Violence: None Noted Violent Behavior Description:  (patient calm and cooperative; "very anxious") Does patient have access to weapons?: No Criminal Charges Pending?: No Does patient have a court date: No Prior Inpatient Therapy: Prior Inpatient Therapy: No Prior Therapy Dates:  (n/a) Prior Therapy Facilty/Provider(s):  (n/a) Reason for Treatment:  (n/a) Prior Outpatient Therapy: Prior Outpatient Therapy: Yes Prior Therapy Dates:  (current ) Prior Therapy Facilty/Provider(s):  ("Dr. Loni Muse at [facility]") Reason for Treatment:  (medication management )  Current Facility-Administered Medications  Medication Dose Route Frequency Provider Last Rate Last Dose  . acetaminophen (TYLENOL) tablet 650 mg  650 mg Oral Q4H PRN Orlie Dakin, MD      . atenolol (TENORMIN) tablet 50 mg  50 mg Oral Daily Orlie Dakin, MD   50 mg at 12/02/14 1055   And  . chlorthalidone (HYGROTON) tablet 25 mg  25 mg Oral Daily Orlie Dakin, MD  25 mg at 12/02/14 1055  . cholecalciferol (VITAMIN D) tablet 400 Units  400 Units Oral BID Orlie Dakin, MD   400 Units at 12/02/14 1055  . fenofibrate tablet 160 mg  160 mg Oral Daily Orlie Dakin, MD   160 mg at 12/02/14 1055  . gabapentin (NEURONTIN) capsule 600 mg  600 mg Oral TID Orlie Dakin, MD   600 mg at 12/02/14 1055  . magnesium oxide (MAG-OX) tablet 400 mg  400 mg Oral Daily Minda Ditto, RPH   400 mg at 12/02/14 1055  . nicotine (NICODERM CQ - dosed in mg/24 hours) patch 21 mg  21 mg Transdermal Daily Orlie Dakin, MD   21 mg at 12/01/14 0941  . nicotine (NICODERM CQ - dosed in mg/24 hours) patch 21 mg  21 mg Transdermal Daily Orlie Dakin,  MD   21 mg at 12/01/14 1255  . pantoprazole (PROTONIX) EC tablet 40 mg  40 mg Oral Daily Orlie Dakin, MD   40 mg at 12/02/14 1055  . rosuvastatin (CRESTOR) tablet 40 mg  40 mg Oral Daily Orlie Dakin, MD   40 mg at 12/02/14 1055  . tiotropium (SPIRIVA) inhalation capsule 18 mcg  18 mcg Inhalation Daily Orlie Dakin, MD   18 mcg at 12/02/14 0731  . trihexyphenidyl (ARTANE) tablet 2 mg  2 mg Oral TID WC Patrecia Pour, NP   2 mg at 12/02/14 1201  . zolpidem (AMBIEN) tablet 5 mg  5 mg Oral QHS,MR X 1 Spencer E Simon, PA-C   5 mg at 12/02/14 6659   Current Outpatient Prescriptions  Medication Sig Dispense Refill  . atenolol-chlorthalidone (TENORETIC) 50-25 MG per tablet Take 1 tablet by mouth daily.    . Cholecalciferol (VITAMIN D PO) Take 1 capsule by mouth 2 (two) times daily.    . fenofibrate 160 MG tablet Take 160 mg by mouth daily.     Marland Kitchen gabapentin (NEURONTIN) 600 MG tablet Take 600 mg by mouth 3 (three) times daily.    . haloperidol decanoate (HALDOL DECANOATE) 100 MG/ML injection Take 100 mg by mouth every 28 (twenty-eight) days.     . Magnesium 250 MG TABS Take 250 mg by mouth daily.     . naproxen (NAPROSYN) 500 MG tablet Take 500 mg by mouth 2 (two) times daily with a meal.    . nitroGLYCERIN (NITROSTAT) 0.4 MG SL tablet Place 1 tablet (0.4 mg total) under the tongue every 5 (five) minutes as needed for chest pain. 30 tablet 12  . rosuvastatin (CRESTOR) 40 MG tablet Take 40 mg by mouth daily.    Marland Kitchen tiotropium (SPIRIVA) 18 MCG inhalation capsule Place 18 mcg into inhaler and inhale daily.    . fenofibrate (TRICOR) 48 MG tablet Take 3 tablets (145 mg total) by mouth daily. (Patient not taking: Reported on 12/01/2014) 30 tablet 2  . gabapentin (NEURONTIN) 300 MG capsule Take 1 capsule (300 mg total) by mouth 3 (three) times daily. (Patient not taking: Reported on 12/01/2014) 120 capsule 1  . nicotine (EQ NICOTINE) 21 mg/24hr patch Place 1 patch (21 mg total) onto the skin daily. (Patient  not taking: Reported on 12/01/2014) 28 patch 0  . pantoprazole (PROTONIX) 40 MG tablet Take 1 tablet (40 mg total) by mouth daily. (Patient not taking: Reported on 12/01/2014) 30 tablet 4  . rosuvastatin (CRESTOR) 20 MG tablet Take 1 tablet (20 mg total) by mouth at bedtime. (Patient not taking: Reported on 12/01/2014) 30 tablet 2    Musculoskeletal:  Strength & Muscle Tone: within normal limits Gait & Station: normal Patient leans: N/A  Psychiatric Specialty Exam:     Blood pressure 124/69, pulse 82, temperature 97.2 F (36.2 C), temperature source Oral, resp. rate 16, SpO2 96 %.There is no weight on file to calculate BMI.  General Appearance: Disheveled  Eye Contact::  Good  Speech:  Normal Rate  Volume:  Normal  Mood:  Depressed  Affect:  Congruent  Thought Process:  Coherent  Orientation:  Full (Time, Place, and Person)  Thought Content:  WDL  Suicidal Thoughts:  No  Homicidal Thoughts:  No  Memory:  Immediate;   Fair Recent;   Fair Remote;   Fair  Judgement:  Poor  Insight:  Fair  Psychomotor Activity:  Normal  Concentration:  Fair  Recall:  [employer]  Language: Fair  Akathisia:  No  Handed:  Right  AIMS (if indicated):     Assets:  Leisure Time Physical Health Resilience  ADL's:  Intact  Cognition: WNL  Sleep:      Medical Decision Making: Review of Psycho-Social Stressors (1), Review or order clinical lab tests (1) and Review of Medication Regimen & Side Effects (2)  Treatment Plan Summary: Plan discharge home  Plan:  discharge home Disposition: Discharge home  Delfin Gant, PMHNP-BC 12/02/2014 2:51 PM Patient seen face-to-face for psychiatric evaluation, chart reviewed and case discussed with the physician extender and developed treatment plan. Reviewed the information documented and agree with the treatment plan. Corena Pilgrim, MD

## 2014-12-02 NOTE — BH Assessment (Signed)
[facility] Assessment Progress Note  Per Thedore MinsMojeed Akintayo, MD, this pt does not require psychiatric hospitalization at this time.  [facility].  This has been included in his discharge instructions.  Pt's nurse, Minerva Areolaric, has been notified.  Doylene Canninghomas Cebert Dettmann, MA Triage Specialist 12/02/2014 @ 10:29

## 2014-12-11 ENCOUNTER — Emergency Department (HOSPITAL_COMMUNITY): Payer: Medicare Other

## 2014-12-11 ENCOUNTER — Inpatient Hospital Stay (HOSPITAL_COMMUNITY)
Admission: EM | Admit: 2014-12-11 | Discharge: 2014-12-13 | DRG: 641 | Disposition: A | Discharge status: Home / Self Care | Payer: Medicare Other | Attending: Internal Medicine | Admitting: Internal Medicine

## 2014-12-11 ENCOUNTER — Encounter (HOSPITAL_COMMUNITY): Payer: Self-pay | Admitting: Emergency Medicine

## 2014-12-11 DIAGNOSIS — R42 Dizziness and giddiness: Secondary | ICD-10-CM

## 2014-12-11 DIAGNOSIS — E871 Hypo-osmolality and hyponatremia: Principal | ICD-10-CM | POA: Diagnosis present

## 2014-12-11 DIAGNOSIS — Z791 Long term (current) use of non-steroidal anti-inflammatories (NSAID): Secondary | ICD-10-CM

## 2014-12-11 DIAGNOSIS — E785 Hyperlipidemia, unspecified: Secondary | ICD-10-CM | POA: Diagnosis present

## 2014-12-11 DIAGNOSIS — I4581 Long QT syndrome: Secondary | ICD-10-CM | POA: Diagnosis present

## 2014-12-11 DIAGNOSIS — Z809 Family history of malignant neoplasm, unspecified: Secondary | ICD-10-CM

## 2014-12-11 DIAGNOSIS — Z8249 Family history of ischemic heart disease and other diseases of the circulatory system: Secondary | ICD-10-CM

## 2014-12-11 DIAGNOSIS — F17210 Nicotine dependence, cigarettes, uncomplicated: Secondary | ICD-10-CM | POA: Diagnosis present

## 2014-12-11 DIAGNOSIS — E780 Pure hypercholesterolemia: Secondary | ICD-10-CM | POA: Diagnosis present

## 2014-12-11 DIAGNOSIS — I10 Essential (primary) hypertension: Secondary | ICD-10-CM | POA: Diagnosis present

## 2014-12-11 DIAGNOSIS — Z7982 Long term (current) use of aspirin: Secondary | ICD-10-CM

## 2014-12-11 DIAGNOSIS — F209 Schizophrenia, unspecified: Secondary | ICD-10-CM | POA: Diagnosis present

## 2014-12-11 DIAGNOSIS — E876 Hypokalemia: Secondary | ICD-10-CM | POA: Diagnosis present

## 2014-12-11 DIAGNOSIS — E860 Dehydration: Secondary | ICD-10-CM | POA: Diagnosis present

## 2014-12-11 DIAGNOSIS — Z79899 Other long term (current) drug therapy: Secondary | ICD-10-CM | POA: Diagnosis not present

## 2014-12-11 DIAGNOSIS — R9431 Abnormal electrocardiogram [ECG] [EKG]: Secondary | ICD-10-CM | POA: Diagnosis present

## 2014-12-11 LAB — OSMOLALITY: Osmolality: 263 mOsm/kg — ABNORMAL LOW (ref 275–300)

## 2014-12-11 LAB — DIFFERENTIAL
Basophils Absolute: 0 10*3/uL (ref 0.0–0.1)
Basophils Relative: 0 % (ref 0–1)
EOS ABS: 0.1 10*3/uL (ref 0.0–0.7)
EOS PCT: 1 % (ref 0–5)
LYMPHS PCT: 12 % (ref 12–46)
Lymphs Abs: 1.7 10*3/uL (ref 0.7–4.0)
MONOS PCT: 8 % (ref 3–12)
Monocytes Absolute: 1.1 10*3/uL — ABNORMAL HIGH (ref 0.1–1.0)
Neutro Abs: 10.7 10*3/uL — ABNORMAL HIGH (ref 1.7–7.7)
Neutrophils Relative %: 79 % — ABNORMAL HIGH (ref 43–77)

## 2014-12-11 LAB — CBC
HEMATOCRIT: 44.5 % (ref 39.0–52.0)
Hemoglobin: 16.8 g/dL (ref 13.0–17.0)
MCH: 37.8 pg — ABNORMAL HIGH (ref 26.0–34.0)
MCHC: 37.8 g/dL — ABNORMAL HIGH (ref 30.0–36.0)
MCV: 100 fL (ref 78.0–100.0)
Platelets: 200 10*3/uL (ref 150–400)
RBC: 4.45 MIL/uL (ref 4.22–5.81)
RDW: 14.1 % (ref 11.5–15.5)
WBC: 13.6 10*3/uL — AB (ref 4.0–10.5)

## 2014-12-11 LAB — BASIC METABOLIC PANEL
Anion gap: 15 (ref 5–15)
Anion gap: 6 (ref 5–15)
BUN: 13 mg/dL (ref 6–23)
BUN: 14 mg/dL (ref 6–23)
CALCIUM: 8.8 mg/dL (ref 8.4–10.5)
CALCIUM: 9 mg/dL (ref 8.4–10.5)
CO2: 22 mmol/L (ref 19–32)
CO2: 27 mmol/L (ref 19–32)
CREATININE: 0.87 mg/dL (ref 0.50–1.35)
CREATININE: 0.78 mg/dL (ref 0.50–1.35)
Chloride: 83 mmol/L — ABNORMAL LOW (ref 96–112)
Chloride: 94 mmol/L — ABNORMAL LOW (ref 96–112)
GFR calc Af Amer: >90 mL/min (ref >90)
GFR calc Af Amer: >90 mL/min (ref >90)
GFR calc non Af Amer: >90 mL/min (ref >90)
GLUCOSE: 165 mg/dL — AB (ref 70–99)
Glucose, Bld: 148 mg/dL — ABNORMAL HIGH (ref 70–99)
Potassium: 2.9 mmol/L — ABNORMAL LOW (ref 3.5–5.1)
Potassium: 3.3 mmol/L — ABNORMAL LOW (ref 3.5–5.1)
SODIUM: 120 mmol/L — AB (ref 135–145)
SODIUM: 127 mmol/L — AB (ref 135–145)

## 2014-12-11 LAB — PROTIME-INR
INR: 1.01 (ref 0.00–1.49)
Prothrombin Time: 13.4 seconds (ref 11.6–15.2)

## 2014-12-11 LAB — CREATININE, URINE, RANDOM: CREATININE, URINE: 80.85 mg/dL

## 2014-12-11 LAB — TSH: TSH: 0.833 u[IU]/mL (ref 0.350–4.500)

## 2014-12-11 LAB — MAGNESIUM: MAGNESIUM: 1.8 mg/dL (ref 1.5–2.5)

## 2014-12-11 LAB — OSMOLALITY, URINE: OSMOLALITY UR: 226 mosm/kg — AB (ref 390–1090)

## 2014-12-11 LAB — SODIUM, URINE, RANDOM: Sodium, Ur: <10 mmol/L

## 2014-12-11 MED ORDER — ONDANSETRON HCL 4 MG/2ML IJ SOLN: 4.0000 mg | Freq: Four times a day (QID) | INTRAMUSCULAR | Status: DC | PRN

## 2014-12-11 MED ORDER — ACETAMINOPHEN 650 MG RE SUPP: 650.0000 mg | Freq: Four times a day (QID) | RECTAL | Status: DC | PRN

## 2014-12-11 MED ORDER — DOCUSATE SODIUM 100 MG PO CAPS
100.0000 mg | ORAL_CAPSULE | Freq: Every day | ORAL | Status: DC
  Administered 2014-12-11 – 2014-12-13 (×3): 100 mg via ORAL
  Filled 2014-12-11 (×3): qty 1

## 2014-12-11 MED ORDER — LORAZEPAM 2 MG/ML IJ SOLN
1.0000 mg | Freq: Once | INTRAMUSCULAR | Status: AC
  Administered 2014-12-11: 1 mg via INTRAVENOUS
  Filled 2014-12-11: qty 1

## 2014-12-11 MED ORDER — SODIUM CHLORIDE 0.9 % IV BOLUS (SEPSIS)
1000.0000 mL | Freq: Once | INTRAVENOUS | Status: AC
  Administered 2014-12-11: 1000 mL via INTRAVENOUS

## 2014-12-11 MED ORDER — ZOLPIDEM TARTRATE 5 MG PO TABS
5.0000 mg | ORAL_TABLET | Freq: Every evening | ORAL | Status: DC | PRN
  Administered 2014-12-11 – 2014-12-12 (×2): 5 mg via ORAL
  Filled 2014-12-11 (×2): qty 1

## 2014-12-11 MED ORDER — ASPIRIN 81 MG PO CHEW
81.0000 mg | CHEWABLE_TABLET | Freq: Every day | ORAL | Status: DC
  Administered 2014-12-11 – 2014-12-13 (×3): 81 mg via ORAL
  Filled 2014-12-11 (×3): qty 1

## 2014-12-11 MED ORDER — POTASSIUM CHLORIDE CRYS ER 20 MEQ PO TBCR
40.0000 meq | EXTENDED_RELEASE_TABLET | Freq: Once | ORAL | Status: AC
  Administered 2014-12-11: 40 meq via ORAL
  Filled 2014-12-11: qty 2

## 2014-12-11 MED ORDER — MECLIZINE HCL 25 MG PO TABS
25.0000 mg | ORAL_TABLET | Freq: Three times a day (TID) | ORAL | Status: DC | PRN
  Filled 2014-12-11: qty 1

## 2014-12-11 MED ORDER — POTASSIUM CHLORIDE 10 MEQ/100ML IV SOLN
10.0000 meq | Freq: Once | INTRAVENOUS | Status: AC
  Administered 2014-12-11: 10 meq via INTRAVENOUS
  Filled 2014-12-11: qty 100

## 2014-12-11 MED ORDER — HEPARIN SODIUM (PORCINE) 5000 UNIT/ML IJ SOLN
5000.0000 [IU] | Freq: Three times a day (TID) | INTRAMUSCULAR | Status: DC
  Administered 2014-12-11 – 2014-12-13 (×5): 5000 [IU] via SUBCUTANEOUS
  Filled 2014-12-11 (×9): qty 1

## 2014-12-11 MED ORDER — MAGNESIUM SULFATE 2 GM/50ML IV SOLN
2.0000 g | Freq: Once | INTRAVENOUS | Status: AC
  Administered 2014-12-11: 2 g via INTRAVENOUS
  Filled 2014-12-11: qty 50

## 2014-12-11 MED ORDER — ONDANSETRON HCL 4 MG PO TABS: 4.0000 mg | ORAL_TABLET | Freq: Four times a day (QID) | ORAL | Status: DC | PRN

## 2014-12-11 MED ORDER — POTASSIUM CHLORIDE CRYS ER 20 MEQ PO TBCR
40.0000 meq | EXTENDED_RELEASE_TABLET | Freq: Four times a day (QID) | ORAL | Status: AC
  Administered 2014-12-11 (×2): 40 meq via ORAL
  Filled 2014-12-11 (×2): qty 2

## 2014-12-11 MED ORDER — SODIUM CHLORIDE 0.9 % IV SOLN
INTRAVENOUS | Status: DC
  Administered 2014-12-11 (×2): via INTRAVENOUS
  Administered 2014-12-12: 1000 mL via INTRAVENOUS
  Administered 2014-12-12: 09:00:00 via INTRAVENOUS
  Administered 2014-12-13: 1000 mL via INTRAVENOUS

## 2014-12-11 MED ORDER — FENOFIBRATE 160 MG PO TABS
160.0000 mg | ORAL_TABLET | Freq: Every day | ORAL | Status: DC
  Administered 2014-12-11 – 2014-12-13 (×3): 160 mg via ORAL
  Filled 2014-12-11 (×3): qty 1

## 2014-12-11 MED ORDER — HYDROCODONE-ACETAMINOPHEN 5-325 MG PO TABS: 1.0000 | ORAL_TABLET | ORAL | Status: DC | PRN

## 2014-12-11 MED ORDER — NITROGLYCERIN 0.4 MG SL SUBL: 0.4000 mg | SUBLINGUAL_TABLET | SUBLINGUAL | Status: DC | PRN

## 2014-12-11 MED ORDER — TIOTROPIUM BROMIDE MONOHYDRATE 18 MCG IN CAPS
18.0000 ug | ORAL_CAPSULE | Freq: Every day | RESPIRATORY_TRACT | Status: DC
  Administered 2014-12-12 – 2014-12-13 (×2): 18 ug via RESPIRATORY_TRACT
  Filled 2014-12-11: qty 5

## 2014-12-11 MED ORDER — ACETAMINOPHEN 325 MG PO TABS: 650.0000 mg | ORAL_TABLET | Freq: Four times a day (QID) | ORAL | Status: DC | PRN

## 2014-12-11 MED ORDER — ROSUVASTATIN CALCIUM 40 MG PO TABS
40.0000 mg | ORAL_TABLET | Freq: Every day | ORAL | Status: DC
  Administered 2014-12-11 – 2014-12-12 (×2): 40 mg via ORAL
  Filled 2014-12-11 (×3): qty 1

## 2014-12-11 NOTE — ED Notes (Signed)
Pt states that when he woke up yesterday morning he was dizzy and has been ever since.  Pt states that nothing makes it better or worse.  Pt states that he has HTN and been taking his meds regularly.

## 2014-12-11 NOTE — H&P (Signed)
[facility] History and Physical  Berel Najjar OZH:[id number] DOB: Sep 05, 1965 DOA: 12/11/2014  Referring physician: EDP PCP: Massie Maroon, FNP   Chief Complaint: Dizziness (vertigo)  HPI: [name] [name] is a 49 y.o. male with past vagal history of hypertension and schizophrenia, patient presented to the hospital complaining about dizziness. Patient said he was in his usual state of health till yesterday morning when he woke up he has some dizziness, when he was asked more about that he was describing vertigo with the room spinning around him. Initially he said he does not remember anything making the dizziness worse or better, I was there all the time, but he developed more vertigo sensation when he tilted his head to the right side when I was interviewing him. Patient was presented yesterday to urgent care in [place] they wanted to admit him to the hospital but he refused so he presented today to [facility] with the same complaints. In the ED sodium is 120, potassium 2.9, patient is on chlorthalidone and takes Haldol for schizophrenia.  Review of Systems:  Constitutional: negative for anorexia, fevers and sweats Eyes: negative for irritation, redness and visual disturbance Ears, nose, mouth, throat, and face: negative for earaches, epistaxis, nasal congestion and sore throat Respiratory: negative for cough, dyspnea on exertion, sputum and wheezing Cardiovascular: negative for chest pain, dyspnea, lower extremity edema, orthopnea, palpitations and syncope Gastrointestinal: negative for abdominal pain, constipation, diarrhea, melena, nausea and vomiting Genitourinary:negative for dysuria, frequency and hematuria Hematologic/lymphatic: negative for bleeding, easy bruising and lymphadenopathy Musculoskeletal:negative for arthralgias, muscle weakness and stiff joints Neurological: Vertigo Endocrine: negative for diabetic symptoms including polydipsia, polyuria and weight  loss Allergic/Immunologic: negative for anaphylaxis, hay fever and urticaria  Past Medical History  Diagnosis Date  . High cholesterol   . Hypertension   . Schizophrenia, schizo-affective    Past Surgical History  Procedure Laterality Date  . Left heart catheterization with coronary angiogram N/A 03/12/2014    Procedure: LEFT HEART CATHETERIZATION WITH CORONARY ANGIOGRAM;  Surgeon: Micheline Chapman, MD;  Location[facility] C[facility] LAB;  Service: Cardiovascular;  Laterality: N/A;   Social History:   reports that he has been smoking Cigarettes.  He has a 4 pack-year smoking history. He does not have any smokeless tobacco history on file. He reports that he does not drink alcohol or use illicit drugs.  No Known Allergies  Family History  Problem Relation Age of Onset  . Cancer Father   . Heart attack Maternal Grandmother   . Heart disease Maternal Grandmother   . Heart attack Maternal Grandfather   . Heart disease Maternal Grandfather   . Heart attack Paternal Grandfather   . Heart disease Paternal Grandfather      Prior to Admission medications   Medication Sig Start Date End Date Taking? Authorizing Provider  aspirin 81 MG chewable tablet Chew 81 mg by mouth daily.   Yes Historical Provider, MD  atenolol-chlorthalidone (TENORETIC) 50-25 MG per tablet Take 1 tablet by mouth daily.   Yes Historical Provider, MD  Cholecalciferol (VITAMIN D PO) Take 1 capsule by mouth 2 (two) times daily.   Yes Historical Provider, MD  CRESTOR 40 MG tablet Take 1 tablet by mouth at bedtime. 11/26/14  Yes Historical Provider, MD  docusate sodium (COLACE) 100 MG capsule Take 100 mg by mouth daily.   Yes Historical Provider, MD  fenofibrate 160 MG tablet Take 160 mg by mouth daily.  11/25/14  Yes Historical Provider, MD  gabapentin (NEURONTIN) 600 MG tablet Take  600 mg by mouth 3 (three) times daily.   Yes Historical Provider, MD  haloperidol decanoate (HALDOL DECANOATE) 100 MG/ML injection Inject 100 mg into the  muscle every 28 (twenty-eight) days.  11/24/14  Yes Historical Provider, MD  Magnesium 250 MG TABS Take 250 mg by mouth daily.    Yes Historical Provider, MD  naproxen (NAPROSYN) 500 MG tablet Take 500 mg by mouth 2 (two) times daily with a meal.   Yes Historical Provider, MD  tiotropium (SPIRIVA) 18 MCG inhalation capsule Place 18 mcg into inhaler and inhale daily.   Yes Historical Provider, MD  trihexyphenidyl (ARTANE) 2 MG tablet Take 1 tablet (2 mg total) by mouth 3 (three) times daily with meals. 12/02/14  Yes Earney Navy, NP  fenofibrate (TRICOR) 48 MG tablet Take 3 tablets (145 mg total) by mouth daily. Patient not taking: Reported on 12/01/2014 04/10/14   Massie Maroon, FNP  gabapentin (NEURONTIN) 300 MG capsule Take 1 capsule (300 mg total) by mouth 3 (three) times daily. Patient not taking: Reported on 12/01/2014 04/07/14   Massie Maroon, FNP  nitroGLYCERIN (NITROSTAT) 0.4 MG SL tablet Place 1 tablet (0.4 mg total) under the tongue every 5 (five) minutes as needed for chest pain. Patient not taking: Reported on 12/11/2014 03/12/14   Ripudeep Jenna Luo, MD  pantoprazole (PROTONIX) 40 MG tablet Take 1 tablet (40 mg total) by mouth daily. Patient not taking: Reported on 12/01/2014 03/12/14   Ripudeep Jenna Luo, MD   Physical Exam: Filed Vitals:   12/11/14 1151  BP: 127/60  Pulse: 73  Temp: 97.8 F (36.6 C)  Resp: 20   Constitutional: Oriented to person, place, and time. Well-developed and well-nourished. Cooperative.  Head: Normocephalic and atraumatic.  Nose: Nose normal.  Mouth/Throat: Uvula is midline, oropharynx is clear and moist and mucous membranes are normal.  Eyes: Conjunctivae and EOM are normal. Pupils are equal, round, and reactive to light.  Neck: Trachea normal and normal range of motion. Neck supple.  Cardiovascular: Normal rate, regular rhythm, S1 normal, S2 normal, normal heart sounds and intact distal pulses.   Pulmonary/Chest: Effort normal and breath sounds normal.   Abdominal: Soft. Bowel sounds are normal. There is no hepatosplenomegaly. There is no tenderness.  Musculoskeletal: Normal range of motion.  Neurological: Alert and oriented to person, place, and time. Has normal strength. No cranial nerve deficit or sensory deficit.  Skin: Skin is warm, dry and intact.  Psychiatric: Has a normal mood and affect. Speech is normal and behavior is normal.   Labs on Admission:  Basic Metabolic Panel:  Recent Labs Lab 12/11/14 0825  NA 120*  K 2.9*  CL 83*  CO2 22  GLUCOSE 165*  BUN 13  CREATININE 0.87  CALCIUM 8.8  MG 1.8   Liver Function Tests: No results for input(s): AST, ALT, ALKPHOS, BILITOT, PROT, ALBUMIN in the last 168 hours. No results for input(s): LIPASE, AMYLASE in the last 168 hours. No results for input(s): AMMONIA in the last 168 hours. CBC:  Recent Labs Lab 12/11/14 0825  WBC 13.6*  NEUTROABS 10.7*  HGB 16.8  HCT 44.5  MCV 100.0  PLT 200   Cardiac Enzymes: No results for input(s): CKTOTAL, CKMB, CKMBINDEX, TROPONINI in the last 168 hours.  BNP (last 3 results) No results for input(s): BNP in the last 8760 hours.  ProBNP (last 3 results)  Recent Labs  03/09/14 1407  PROBNP 61.1    CBG: No results for input(s): GLUCAP in the last  168 hours.  Radiological Exams on Admission: Dg Chest 2 View  12/11/2014   CLINICAL DATA:  Dizziness  EXAM: CHEST  2 VIEW  COMPARISON:  04/05/2014  FINDINGS: The heart size and mediastinal contours are within normal limits. Both lungs are clear. The visualized skeletal structures are unremarkable.  IMPRESSION: No active cardiopulmonary disease.   Electronically Signed   By: Marlan Palauharles  Clark M.D.   On: 12/11/2014 08:40    EKG: Independently reviewed.   Assessment/Plan Principal Problem:   Dizziness Active Problems:   Hypokalemia   Hyponatremia    Hyponatremia Patient presented with dizziness and vertigo. Sodium is 120 mEq. Check urine sodium, osmolality and plasma  osmolality. Patient on chlorthalidone, hold for now. Patient appears to be euvolemic. Started on sodium chloride at 100 mL per hour, check BMP every 12 hours, avoid correction rate of more than 0.5 mEq/Hr.  Hypokalemia Unclear cause, replace with oral supplements. Repeat BMP in a.m.  Vertigo Patient described vertigo rather than dizziness when he asked more about his "dizziness". Has slight nystagmus towards the left side, her last PT to evaluate him for vestibular rehabilitation. Meclizine as needed.  Prolonged QT Patient has QTC of 601, keep potassium above 4 and magnesium above 2. Repeat 12-lead EKG, if still prolonged QTC he might need to be on telemetry to rule out tachyarrhythmias.   Code Status: Full code Family Communication: Plan discussed with the patient in the presence of his wife at bedside.  Disposition Plan: MedSurg, repeat EKG if her long QT might need telemetry.  Time spent: 70 minutes  Georgian Mcclory A, MD [facility] Pager [phone]

## 2014-12-11 NOTE — ED Provider Notes (Signed)
CSN: [id number]     Arrival date & time 12/11/14  [id number] History   First MD Initiated Contact with Patient 12/11/14 [phone]     Chief Complaint  Patient presents with  . Dizziness     (Consider location/radiation/quality/duration/timing/severity/associated sxs/prior Treatment) HPI  [name] FerrisBobby [name] is a 49 y.o. male with PMH of HTN, dyslipidemia, schizophrenia presenting with dizziness described as vertigo that started yesterday. He denies aggravating and alleviating factors. Never had this before. He denies history of stroke. Pt [facility] and was admitted for hyponatremia but left AMA because wife did not have ride. CT head performed at that time without acute abnormalities. Pt requesting outpatient treatment for it. He denies headache, visual changes, slurred speech, weakness. No chest pain, SOB. Pt with dry cough and is smoker. No fevers or chills. He denies congestion or illness recently. He adamantly denies alcohol use. Pt takes atenolol-chlorthalidone without changes and reports compliance.   Past Medical History  Diagnosis Date  . High cholesterol   . Hypertension   . Schizophrenia, schizo-affective    Past Surgical History  Procedure Laterality Date  . Left heart catheterization with coronary angiogram N/A 03/12/2014    Procedure: LEFT HEART CATHETERIZATION WITH CORONARY ANGIOGRAM;  Surgeon: Micheline ChapmanMichael D Cooper, MD;  [facility];  Service: Cardiovascular;  Laterality: N/A;   Family History  Problem Relation Age of Onset  . Cancer Father   . Heart attack Maternal Grandmother   . Heart disease Maternal Grandmother   . Heart attack Maternal Grandfather   . Heart disease Maternal Grandfather   . Heart attack Paternal Grandfather   . Heart disease Paternal Grandfather    History  Substance Use Topics  . Smoking status: Current Every Day Smoker -- 1.00 packs/day for 4 years    Types: Cigarettes  . Smokeless tobacco: Not on file  . Alcohol Use: No     Review of Systems 10 Systems reviewed and are negative for acute change except as noted in the HPI.    Allergies  Review of patient's allergies indicates no known allergies.  Home Medications   Prior to Admission medications   Medication Sig Start Date End Date Taking? Authorizing Provider  aspirin 81 MG chewable tablet Chew 81 mg by mouth daily.   Yes Historical Provider, MD  atenolol-chlorthalidone (TENORETIC) 50-25 MG per tablet Take 1 tablet by mouth daily.   Yes Historical Provider, MD  Cholecalciferol (VITAMIN D PO) Take 1 capsule by mouth 2 (two) times daily.   Yes Historical Provider, MD  CRESTOR 40 MG tablet Take 1 tablet by mouth at bedtime. 11/26/14  Yes Historical Provider, MD  docusate sodium (COLACE) 100 MG capsule Take 100 mg by mouth daily.   Yes Historical Provider, MD  fenofibrate 160 MG tablet Take 160 mg by mouth daily.  11/25/14  Yes Historical Provider, MD  gabapentin (NEURONTIN) 600 MG tablet Take 600 mg by mouth 3 (three) times daily.   Yes Historical Provider, MD  haloperidol decanoate (HALDOL DECANOATE) 100 MG/ML injection Inject 100 mg into the muscle every 28 (twenty-eight) days.  11/24/14  Yes Historical Provider, MD  Magnesium 250 MG TABS Take 250 mg by mouth daily.    Yes Historical Provider, MD  naproxen (NAPROSYN) 500 MG tablet Take 500 mg by mouth 2 (two) times daily with a meal.   Yes Historical Provider, MD  tiotropium (SPIRIVA) 18 MCG inhalation capsule Place 18 mcg into inhaler and inhale daily.   Yes Historical Provider, MD  trihexyphenidyl (ARTANE) 2 MG tablet Take 1 tablet (2 mg total) by mouth 3 (three) times daily with meals. 12/02/14  Yes Earney Navy, NP  fenofibrate (TRICOR) 48 MG tablet Take 3 tablets (145 mg total) by mouth daily. Patient not taking: Reported on 12/01/2014 04/10/14   Massie Maroon, FNP  gabapentin (NEURONTIN) 300 MG capsule Take 1 capsule (300 mg total) by mouth 3 (three) times daily. Patient not taking: Reported on  12/01/2014 04/07/14   Massie Maroon, FNP  nitroGLYCERIN (NITROSTAT) 0.4 MG SL tablet Place 1 tablet (0.4 mg total) under the tongue every 5 (five) minutes as needed for chest pain. Patient not taking: Reported on 12/11/2014 03/12/14   Ripudeep Jenna Luo, MD  pantoprazole (PROTONIX) 40 MG tablet Take 1 tablet (40 mg total) by mouth daily. Patient not taking: Reported on 12/01/2014 03/12/14   Ripudeep K Rai, MD   BP 112/63 mmHg  Pulse 73  Temp(Src) 97.6 F (36.4 C) (Oral)  Resp 16  SpO2 96% Physical Exam  Constitutional: He appears well-developed and well-nourished. No distress.  HENT:  Head: Normocephalic and atraumatic.  Mouth/Throat: Oropharynx is clear and moist.  Bilateral TM with effusions but no erythema. Light reflexin tact.  Eyes: Conjunctivae and EOM are normal. Pupils are equal, round, and reactive to light. Right eye exhibits no discharge. Left eye exhibits no discharge.  Neck: Normal range of motion. Neck supple.  No nuchal rigidity  Cardiovascular: Normal rate and regular rhythm.   Pulmonary/Chest: Effort normal and breath sounds normal. No respiratory distress. He has no wheezes.  Abdominal: Soft. Bowel sounds are normal. He exhibits no distension. There is no tenderness.  Neurological: He is alert. No cranial nerve deficit. Coordination normal.  Speech is clear and goal oriented.Strength 5/5 in upper and lower extremities. Sensation intact. Intact rapid alternating movements, heel to shin and finger to nose with bilateral tremor which patient states he has had for many years. No pronator drift. Normal gait.  Skin: Skin is warm and dry. He is not diaphoretic.  Nursing note and vitals reviewed.    ED Course  Procedures (including critical care time) Labs Review Labs Reviewed  CBC - Abnormal; Notable for the following:    WBC 13.6 (*)    MCH 37.8 (*)    MCHC 37.8 (*)    All other components within normal limits  BASIC METABOLIC PANEL - Abnormal; Notable for the following:     Sodium 120 (*)    Potassium 2.9 (*)    Chloride 83 (*)    Glucose, Bld 165 (*)    All other components within normal limits  DIFFERENTIAL - Abnormal; Notable for the following:    Neutrophils Relative % 79 (*)    Neutro Abs 10.7 (*)    Monocytes Absolute 1.1 (*)    All other components within normal limits  MAGNESIUM  CREATININE, URINE, RANDOM  SODIUM, URINE, RANDOM  OSMOLALITY, URINE  OSMOLALITY    Imaging Review Dg Chest 2 View  12/11/2014   CLINICAL DATA:  Dizziness  EXAM: CHEST  2 VIEW  COMPARISON:  04/05/2014  FINDINGS: The heart size and mediastinal contours are within normal limits. Both lungs are clear. The visualized skeletal structures are unremarkable.  IMPRESSION: No active cardiopulmonary disease.   Electronically Signed   By: Marlan Palau M.D.   On: 12/11/2014 08:40     EKG Interpretation   Date/Time:  Thursday December 11 2014 07:51:58 EDT Ventricular Rate:  83 PR Interval:  144 QRS  Duration: 95 QT Interval:  511 QTC Calculation: 601 R Axis:   31 Text Interpretation:  Sinus rhythm Prolonged QT interval Otherwise no  significant change Confirmed by Rhunette Croft, MD, ANKIT [id number]) on 12/11/2014  8:37:20 AM      MDM   Final diagnoses:  Dizziness  Hyponatremia  Hypokalemia   Patient presenting with dizziness and denies alleviating or aggravating factors. Neurological exam without deficits for bilateral tremor which patient states he's had for many years. IV fluids and ativan given without reported improvement.  Workup significant for hyponatremia and hypokalemia. Magnesium ordered and patient given IV oral supplementation of potassium. She denies any GI losses or history of congestive heart failure. He adamantly denies alcohol history. He does take a diuretic but denies any changes or increase in dosage. Unclear the cause of patient's severe hyponatremia. Admit for further replenishment and observation. Spoke with Dr. Arthor Captain who agrees to evaluate the patient with  plan for admission.  Discussed all results and patient verbalizes understanding and agrees with plan.  This is a shared patient. This patient was discussed with the physician who saw and evaluated the patient and agrees with the plan.     Oswaldo Conroy, PA-C 12/11/14 1145  Derwood Kaplan, MD 12/12/14 [phone]

## 2014-12-12 ENCOUNTER — Inpatient Hospital Stay (HOSPITAL_COMMUNITY): Payer: Medicare Other

## 2014-12-12 DIAGNOSIS — I4581 Long QT syndrome: Secondary | ICD-10-CM

## 2014-12-12 LAB — MAGNESIUM: Magnesium: 2.2 mg/dL (ref 1.5–2.5)

## 2014-12-12 LAB — BASIC METABOLIC PANEL
ANION GAP: 7 (ref 5–15)
BUN: 10 mg/dL (ref 6–23)
CO2: 22 mmol/L (ref 19–32)
Calcium: 8.5 mg/dL (ref 8.4–10.5)
Chloride: 101 mmol/L (ref 96–112)
Creatinine, Ser: 0.64 mg/dL (ref 0.50–1.35)
GFR calc non Af Amer: >90 mL/min (ref >90)
Glucose, Bld: 118 mg/dL — ABNORMAL HIGH (ref 70–99)
Potassium: 3.6 mmol/L (ref 3.5–5.1)
SODIUM: 130 mmol/L — AB (ref 135–145)

## 2014-12-12 MED ORDER — POTASSIUM CHLORIDE CRYS ER 20 MEQ PO TBCR
40.0000 meq | EXTENDED_RELEASE_TABLET | Freq: Four times a day (QID) | ORAL | Status: AC
  Administered 2014-12-12 (×2): 40 meq via ORAL
  Filled 2014-12-12 (×2): qty 2

## 2014-12-12 NOTE — Evaluation (Signed)
Physical Therapy Evaluation Patient Details Name: Bradley FerrisBobby Stillman MRN: [id number] DOB: Jul 11, 1966 Today's Date: 12/12/2014   History of Present Illness  49 y.o. male with past medical history of hypertension and schizophrenia admitted with dizziness and hyponatremia.  Clinical Impression  Pt admitted with above diagnosis. Pt currently with functional limitations due to the deficits listed below (see PT Problem List).  Pt will benefit from skilled PT to increase their independence and safety with mobility to allow discharge to the venue listed below.   PT Vestibular Exam: Pt reports dizziness/spinning started a couple days ago with onset of waking from sleep.  Dizziness/spinning is constant and does not go away (therefore did not test for BPPV).  Pt denies decreased hearing and ringing in ears.  Performed oculomotor testing and did not observe nystagmus however pt reporting dizziness with all testing and had difficulty focusing with saccades, head shaking, and VOR cancellation.  Pt ambulated in hallway without assistive device and occasional unsteadiness however no true LOB although reports 8/10 dizziness.   Returned to room to bring head shaking and saccades exercises and pt was up in bathroom shaving his face without difficulty.  Pt educated on exercises and will likely not require any further PT upon d/c as he reports his dizziness and mobility has improved since admission.     Follow Up Recommendations No PT follow up    Equipment Recommendations  None recommended by PT    Recommendations for Other Services       Precautions / Restrictions Precautions Precautions: Fall      Mobility  Bed Mobility Overal bed mobility: Modified Independent                Transfers Overall transfer level: Needs assistance Equipment used: None Transfers: Sit to/from Stand Sit to Stand: Supervision            Ambulation/Gait Ambulation/Gait assistance: Supervision Ambulation Distance  (Feet): 160 Feet Assistive device: None Gait Pattern/deviations: Step-through pattern     General Gait Details: occasional unsteadiness however no true LOB observed, recommended focusing on stationary object, observed pt turning head and viewing surroundings without difficulty although pt reports 8/10 dizziness during gait  Stairs            Wheelchair Mobility    Modified Rankin (Stroke Patients Only)       Balance                                             Pertinent Vitals/Pain Pain Assessment: No/denies pain    Home Living Family/patient expects to be discharged to:: Private residence Living Arrangements: Spouse/significant other Available Help at Discharge: Family   Home Access: Stairs to enter Entrance Stairs-Rails: Right Entrance Stairs-Number of Steps: 2-3 Home Layout: One level Home Equipment: None      Prior Function Level of Independence: Independent               Hand Dominance        Extremity/Trunk Assessment   Upper Extremity Assessment: Overall WFL for tasks assessed           Lower Extremity Assessment: Overall WFL for tasks assessed         Communication   Communication: No difficulties  Cognition Arousal/Alertness: Awake/alert Behavior During Therapy: Flat affect Overall Cognitive Status: Within Functional Limits for tasks assessed  General Comments      Exercises        Assessment/Plan    PT Assessment Patient needs continued PT services  PT Diagnosis Other (comment) (dizziness, ?vertigo)   PT Problem List Decreased mobility;Other (comment) (dizziness, ?vertigo)  PT Treatment Interventions DME instruction;Gait training;Functional mobility training;Patient/family education;Therapeutic activities;Therapeutic exercise (compensation techniques, habituation and gaze stability exercises)   PT Goals (Current goals can be found in the Care Plan section) Acute Rehab PT  Goals PT Goal Formulation: With patient Time For Goal Achievement: 12/19/14 Potential to Achieve Goals: Good    Frequency Min 3X/week   Barriers to discharge        Co-evaluation               End of Session Equipment Utilized During Treatment: Gait belt Activity Tolerance: Patient tolerated treatment well Patient left: in bed;with call bell/phone within reach;with family/visitor present           Time: 4098-1191 PT Time Calculation (min) (ACUTE ONLY): 12 min   Charges:   PT Evaluation $Initial PT Evaluation Tier I: 1 Procedure     PT G Codes:        Ariea Rochin,KATHrine E 12/12/2014, 2:59 PM Zenovia Jarred, PT, DPT 12/12/2014 Pager: [phone]

## 2014-12-12 NOTE — Progress Notes (Signed)
[facility] PROGRESS NOTE   [name] FerrisBobby Derousse [name]:[id number] DOB: 1965-08-30 DOA: 12/11/2014 PCP: Massie MaroonHollis,[name], FNP  HPI/Subjective: Feels much better seen with wife at bedside, state complaining about dizziness.  Assessment/Plan: Principal Problem:   Dizziness Active Problems:   Hypokalemia   Hyponatremia   QT prolongation   Hyponatremia Patient presented with dizziness and vertigo. Sodium is 120 mEq. Check urine sodium, osmolality and plasma osmolality. Patient on chlorthalidone, hold for now. Patient appears to be euvolemic. Sodium is improving from 120 up to 130 within 12 hours on IV fluids, continue IV fluids, check BMP in a.m.  Hypokalemia Unclear cause, replace with oral supplements. Potassium 3.6, ensure potassium is more than 4.  Vertigo Patient described vertigo rather than dizziness when he asked more about his "dizziness". Has slight nystagmus towards the left side, her last PT to evaluate him for vestibular rehabilitation. Meclizine as needed. PTA to evaluate.  Prolonged QT Patient has QTC of 601, keep potassium above 4 and magnesium above 2. QT is 4 8080 still prolonged, repeat EKG in a.m.  Code Status: Full Code Family Communication: Plan discussed with the patient. Disposition Plan: Remains inpatient Diet: Diet Heart Room service appropriate?: Yes; Fluid consistency:: Thin  Consultants:  None  Procedures:  None  Antibiotics:  None   Objective: Filed Vitals:   12/12/14 0429  BP: 130/67  Pulse: 72  Temp: 97.9 F (36.6 C)  Resp: 18    Intake/Output Summary (Last 24 hours) at 12/12/14 1132 Last data filed at 12/12/14 0600  Gross per 24 hour  Intake 2106.67 ml  Output      1 ml  Net 2105.67 ml   Filed Weights   12/11/14 1151  Weight: 86.183 kg (190 lb)    Exam: General: Alert and awake, oriented x3, not in any acute distress. HEENT: anicteric sclera, pupils reactive to light and accommodation, EOMI CVS: S1-S2 clear, no  murmur rubs or gallops Chest: clear to auscultation bilaterally, no wheezing, rales or rhonchi Abdomen: soft nontender, nondistended, normal bowel sounds, no organomegaly Extremities: no cyanosis, clubbing or edema noted bilaterally Neuro: Cranial nerves II-XII intact, no focal neurological deficits  Data Reviewed: Basic Metabolic Panel:  Recent Labs Lab 12/11/14 0825 12/11/14 1820 12/12/14 0350 12/12/14 0801  NA 120* 127*  --  130*  K 2.9* 3.3*  --  3.6  CL 83* 94*  --  101  CO2 22 27  --  22  GLUCOSE 165* 148*  --  118*  BUN 13 14  --  10  CREATININE 0.87 0.78  --  0.64  CALCIUM 8.8 9.0  --  8.5  MG 1.8  --  2.2  --    Liver Function Tests: No results for input(s): AST, ALT, ALKPHOS, BILITOT, PROT, ALBUMIN in the last 168 hours. No results for input(s): LIPASE, AMYLASE in the last 168 hours. No results for input(s): AMMONIA in the last 168 hours. CBC:  Recent Labs Lab 12/11/14 0825  WBC 13.6*  NEUTROABS 10.7*  HGB 16.8  HCT 44.5  MCV 100.0  PLT 200   Cardiac Enzymes: No results for input(s): CKTOTAL, CKMB, CKMBINDEX, TROPONINI in the last 168 hours. BNP (last 3 results) No results for input(s): BNP in the last 8760 hours.  ProBNP (last 3 results)  Recent Labs  03/09/14 1407  PROBNP 61.1    CBG: No results for input(s): GLUCAP in the last 168 hours.  Micro No results found for this or any previous visit (from the past 240 hour(s)).  Studies: Dg Chest 2 View  12/11/2014   CLINICAL DATA:  Dizziness  EXAM: CHEST  2 VIEW  COMPARISON:  04/05/2014  FINDINGS: The heart size and mediastinal contours are within normal limits. Both lungs are clear. The visualized skeletal structures are unremarkable.  IMPRESSION: No active cardiopulmonary disease.   Electronically Signed   By: Marlan Palau M.D.   On: 12/11/2014 08:40    Scheduled Meds: . aspirin  81 mg Oral Daily  . docusate sodium  100 mg Oral Daily  . fenofibrate  160 mg Oral Daily  . heparin  5,000  Units Subcutaneous 3 times per day  . rosuvastatin  40 mg Oral QHS  . tiotropium  18 mcg Inhalation Daily   Continuous Infusions: . sodium chloride 100 mL/hr at 12/12/14 0925       Time spent: 35 minutes    [facility]  [facility] Pager [phone] If 7PM-7AM, please contact night-coverage at [email or web address], password [facility] 12/12/2014, 11:32 AM  LOS: 1 day

## 2014-12-12 NOTE — Care Management Note (Signed)
CARE MANAGEMENT NOTE 12/12/2014  Patient:  [name] [name],[name] [name]   Account Number:  [id number]  Date Initiated:  12/12/2014  Documentation initiated by:  Sandford CrazeLEMENTS,Tyresa Prindiville  Subjective/Objective Assessment:   49 yo admitted with Dizziness, Hx of HTN and Schizophrenia     Action/Plan:   From home with spouse   Anticipated DC Date:  12/13/2014   Anticipated DC Plan:  HOME/SELF CARE      DC Planning Services  CM consult      Choice offered to / List presented to:             Status of service:  In process, will continue to follow Medicare Important Message given?   (If response is "NO", the following Medicare IM given date fields will be blank) Date Medicare IM given:   Medicare IM given by:   Date Additional Medicare IM given:   Additional Medicare IM given by:    Discharge Disposition:    Per UR Regulation:  Reviewed for med. necessity/level of care/duration of stay  If discussed at Long Length of Stay Meetings, dates discussed:    Comments:  12/12/14 Sandford Crazeora Sakiyah Shur RN,BSN,NCM [phone] Chart reviewed and no DC needs identified at time of review. CM will continue to follow.   Additional Comments:  Bartholome BillCLEMENTS, Zaccary Creech H, RN 12/12/2014, 3:06 PM

## 2014-12-13 LAB — HEMOGLOBIN A1C
Hgb A1c MFr Bld: 6.4 % — ABNORMAL HIGH (ref 4.8–5.6)
Mean Plasma Glucose: 137 mg/dL

## 2014-12-13 LAB — BASIC METABOLIC PANEL
ANION GAP: 12 (ref 5–15)
BUN: 9 mg/dL (ref 6–23)
CHLORIDE: 100 mmol/L (ref 96–112)
CO2: 24 mmol/L (ref 19–32)
Calcium: 9.1 mg/dL (ref 8.4–10.5)
Creatinine, Ser: 0.63 mg/dL (ref 0.50–1.35)
GFR calc Af Amer: >90 mL/min (ref >90)
GFR calc non Af Amer: >90 mL/min (ref >90)
Glucose, Bld: 109 mg/dL — ABNORMAL HIGH (ref 70–99)
POTASSIUM: 3.8 mmol/L (ref 3.5–5.1)
SODIUM: 136 mmol/L (ref 135–145)

## 2014-12-13 MED ORDER — LORAZEPAM 1 MG PO TABS
1.0000 mg | ORAL_TABLET | Freq: Once | ORAL | Status: AC
  Administered 2014-12-13: 1 mg via ORAL
  Filled 2014-12-13: qty 1

## 2014-12-13 MED ORDER — ATENOLOL 50 MG PO TABS: 50.0000 mg | ORAL_TABLET | Freq: Every day | ORAL | Status: AC

## 2014-12-13 NOTE — Discharge Summary (Addendum)
Physician Discharge Summary  [name] [name] XLK:[id number] DOB: 03-16-1966 DOA: 12/11/2014  PCP: [name] Maroon, FNP  Admit date: 12/11/2014 Discharge date: 12/13/2014  Time spent: 40 minutes  Recommendations for Outpatient Follow-up:  1. Follow-up with primary care physician in one week. 2. Referral to cardiology as outpatient to evaluate for prolonged QTC.  Discharge Diagnoses:  Principal Problem:   Dizziness Active Problems:   Hypokalemia   Hyponatremia   QT prolongation   Discharge Condition: Stable  Diet recommendation: Heart healthy  Filed Weights   12/11/14 1151  Weight: 86.183 kg (190 lb)    History of present illness:  [name] [name] is a 49 y.o. male with past vagal history of hypertension and schizophrenia, patient presented to the hospital complaining about dizziness. Patient said he was in his usual state of health till yesterday morning when he woke up he has some dizziness, when he was asked more about that he was describing vertigo with the room spinning around him. Initially he said he does not remember anything making the dizziness worse or better, I was there all the time, but he developed more vertigo sensation when he tilted his head to the right side when I was interviewing him. Patient was presented yesterday to urgent care in [place] they wanted to admit him to the hospital but he refused so he presented today to [facility] with the same complaints. In the ED sodium is 120, potassium 2.9, patient is on chlorthalidone and takes Haldol for schizophrenia.  Hospital Course:   Hyponatremia Patient presented with dizziness and vertigo. Sodium is 120 mEq on admission. Urine sodium was less than 10, consistent with truly low sodium.  Patient on chlorthalidone, hold for now. Patient appears to be euvolemic. sodium improved steadily from 120, 130 than 136 on the day of discharge.   Hypokalemia This is repleted with oral supplements potassium 3.8 on  discharge.  Vertigo Patient described vertigo rather than dizziness when he asked more about his "dizziness". MRI of the brain was done and showed no evidence of acute findings to explain dizziness. Seen by PT and recommended no further follow-up.  Follow dizziness is likely secondary to dehydration/hyponatremia, if continues as outpatient might benefit from meclizine.  Prolonged QT Patient has QTC of 601, keep potassium above 4 and magnesium above 2. QTC was 488 after repeating the EKG, patient gets Haldol every month. Patient will need further follow-up as outpatient. Either to stop medication that goes QT prolongation and/or referral to a cardiologist.    Procedures:  None  Consultations:  None  Discharge Exam: Filed Vitals:   12/13/14 0445  BP: 138/59  Pulse: 71  Temp: 98.3 F (36.8 C)  Resp: 20   General: Alert and awake, oriented x3, not in any acute distress. HEENT: anicteric sclera, pupils reactive to light and accommodation, EOMI CVS: S1-S2 clear, no murmur rubs or gallops Chest: clear to auscultation bilaterally, no wheezing, rales or rhonchi Abdomen: soft nontender, nondistended, normal bowel sounds, no organomegaly Extremities: no cyanosis, clubbing or edema noted bilaterally Neuro: Cranial nerves II-XII intact, no focal neurological deficits  Discharge Instructions   Discharge Instructions    Diet - low sodium heart healthy    Complete by:  As directed      Increase activity slowly    Complete by:  As directed           Current Discharge Medication List    START taking these medications   Details  atenolol (TENORMIN) 50 MG tablet Take 1  tablet (50 mg total) by mouth daily. Qty: 30 tablet, Refills: 0      CONTINUE these medications which have NOT CHANGED   Details  aspirin 81 MG chewable tablet Chew 81 mg by mouth daily.    Cholecalciferol (VITAMIN D PO) Take 1 capsule by mouth 2 (two) times daily.    CRESTOR 40 MG tablet Take 1 tablet by  mouth at bedtime.    docusate sodium (COLACE) 100 MG capsule Take 100 mg by mouth daily.    fenofibrate 160 MG tablet Take 160 mg by mouth daily.     gabapentin (NEURONTIN) 600 MG tablet Take 600 mg by mouth 3 (three) times daily.    haloperidol decanoate (HALDOL DECANOATE) 100 MG/ML injection Inject 100 mg into the muscle every 28 (twenty-eight) days.     Magnesium 250 MG TABS Take 250 mg by mouth daily.     naproxen (NAPROSYN) 500 MG tablet Take 500 mg by mouth 2 (two) times daily with a meal.    tiotropium (SPIRIVA) 18 MCG inhalation capsule Place 18 mcg into inhaler and inhale daily.    trihexyphenidyl (ARTANE) 2 MG tablet Take 1 tablet (2 mg total) by mouth 3 (three) times daily with meals. Qty: 90 tablet, Refills: 0    nitroGLYCERIN (NITROSTAT) 0.4 MG SL tablet Place 1 tablet (0.4 mg total) under the tongue every 5 (five) minutes as needed for chest pain. Qty: 30 tablet, Refills: 12    pantoprazole (PROTONIX) 40 MG tablet Take 1 tablet (40 mg total) by mouth daily. Qty: 30 tablet, Refills: 4      STOP taking these medications     atenolol-chlorthalidone (TENORETIC) 50-25 MG per tablet      gabapentin (NEURONTIN) 300 MG capsule        No Known Allergies Follow-up Information    Follow up with MILLER, Bartholomew CrewsYAN DEAN, PA In 1 week.   Specialty:  Physician Assistant   Contact information:   [phone] [place] [id number] [phone]        The results of significant diagnostics from this hospitalization (including imaging, microbiology, ancillary and laboratory) are listed below for reference.    Significant Diagnostic Studies: Dg Chest 2 View  12/11/2014   CLINICAL DATA:  Dizziness  EXAM: CHEST  2 VIEW  COMPARISON:  04/05/2014  FINDINGS: The heart size and mediastinal contours are within normal limits. Both lungs are clear. The visualized skeletal structures are unremarkable.  IMPRESSION: No active cardiopulmonary disease.   Electronically Signed   By: Marlan Palauharles   Clark M.D.   On: 12/11/2014 08:40   Mr Brain Wo Contrast  12/12/2014   CLINICAL DATA:  Woke up with dizziness yesterday morning. Symptoms are persistent.  EXAM: MRI HEAD WITHOUT CONTRAST  TECHNIQUE: Multiplanar, multiecho pulse sequences of the brain and surrounding structures were obtained without intravenous contrast.  COMPARISON:  None.  FINDINGS: The brain has a normal appearance on all pulse sequences without evidence of malformation, atrophy, old or acute infarction, mass lesion, hemorrhage, hydrocephalus or extra-axial collection. No pituitary mass. No fluid in the sinuses, middle ears or mastoids. No skull or skullbase lesion. There is flow in the major vessels at the base of the brain. Major venous sinuses show flow.  IMPRESSION: Normal examination.  No cause of dizziness identified.   Electronically Signed   By: Paulina FusiMark  Shogry M.D.   On: 12/12/2014 20:26    Microbiology: No results found for this or any previous visit (from the past 240 hour(s)).  Labs: Basic Metabolic Panel:  Recent Labs Lab 12/11/14 0825 12/11/14 1820 12/12/14 0350 12/12/14 0801 12/13/14 0418  NA 120* 127*  --  130* 136  K 2.9* 3.3*  --  3.6 3.8  CL 83* 94*  --  101 100  CO2 22 27  --  22 24  GLUCOSE 165* 148*  --  118* 109*  BUN 13 14  --  10 9  CREATININE 0.87 0.78  --  0.64 0.63  CALCIUM 8.8 9.0  --  8.5 9.1  MG 1.8  --  2.2  --   --    Liver Function Tests: No results for input(s): AST, ALT, ALKPHOS, BILITOT, PROT, ALBUMIN in the last 168 hours. No results for input(s): LIPASE, AMYLASE in the last 168 hours. No results for input(s): AMMONIA in the last 168 hours. CBC:  Recent Labs Lab 12/11/14 0825  WBC 13.6*  NEUTROABS 10.7*  HGB 16.8  HCT 44.5  MCV 100.0  PLT 200   Cardiac Enzymes: No results for input(s): CKTOTAL, CKMB, CKMBINDEX, TROPONINI in the last 168 hours. BNP: BNP (last 3 results) No results for input(s): BNP in the last 8760 hours.  ProBNP (last 3 results)  Recent Labs   03/09/14 1407  PROBNP 61.1    CBG: No results for input(s): GLUCAP in the last 168 hours.     Signed:  Gabriell Casimir A  [facility] 12/13/2014, 12:05 PM

## 2014-12-13 NOTE — Progress Notes (Signed)
Patient discharged home, all discharge medications and instructions reviewed and questions answered. Patient to be assisted to vehicle by wheelchair.  

## 2014-12-13 NOTE — Plan of Care (Signed)
Problem: Phase II Progression Outcomes Goal: Progress activity as tolerated unless otherwise ordered Outcome: Completed/Met Date Met:  12/13/14 Up as tol in room

## 2015-01-26 ENCOUNTER — Encounter (HOSPITAL_COMMUNITY): Payer: Self-pay | Admitting: Emergency Medicine

## 2015-01-26 ENCOUNTER — Emergency Department (HOSPITAL_COMMUNITY)
Admission: EM | Admit: 2015-01-26 | Discharge: 2015-01-26 | Disposition: A | Discharge status: Home / Self Care | Payer: Medicare Other | Attending: Emergency Medicine | Admitting: Emergency Medicine

## 2015-01-26 DIAGNOSIS — F209 Schizophrenia, unspecified: Secondary | ICD-10-CM | POA: Insufficient documentation

## 2015-01-26 DIAGNOSIS — Z720 Tobacco use: Secondary | ICD-10-CM | POA: Diagnosis not present

## 2015-01-26 DIAGNOSIS — R200 Anesthesia of skin: Secondary | ICD-10-CM | POA: Diagnosis present

## 2015-01-26 DIAGNOSIS — Z791 Long term (current) use of non-steroidal anti-inflammatories (NSAID): Secondary | ICD-10-CM | POA: Diagnosis not present

## 2015-01-26 DIAGNOSIS — Z7982 Long term (current) use of aspirin: Secondary | ICD-10-CM | POA: Diagnosis not present

## 2015-01-26 DIAGNOSIS — E780 Pure hypercholesterolemia: Secondary | ICD-10-CM | POA: Diagnosis not present

## 2015-01-26 DIAGNOSIS — I10 Essential (primary) hypertension: Secondary | ICD-10-CM | POA: Insufficient documentation

## 2015-01-26 DIAGNOSIS — Z9889 Other specified postprocedural states: Secondary | ICD-10-CM | POA: Insufficient documentation

## 2015-01-26 DIAGNOSIS — G629 Polyneuropathy, unspecified: Secondary | ICD-10-CM | POA: Insufficient documentation

## 2015-01-26 DIAGNOSIS — Z79899 Other long term (current) drug therapy: Secondary | ICD-10-CM | POA: Insufficient documentation

## 2015-01-26 LAB — COMPREHENSIVE METABOLIC PANEL
ALBUMIN: 4.1 g/dL (ref 3.5–5.0)
ALK PHOS: 66 U/L (ref 38–126)
ALT: 12 U/L — ABNORMAL LOW (ref 17–63)
ANION GAP: 12 (ref 5–15)
AST: 19 U/L (ref 15–41)
BUN: 10 mg/dL (ref 6–20)
CALCIUM: 9.4 mg/dL (ref 8.9–10.3)
CO2: 23 mmol/L (ref 22–32)
CREATININE: 0.61 mg/dL (ref 0.61–1.24)
Chloride: 99 mmol/L — ABNORMAL LOW (ref 101–111)
GFR calc non Af Amer: >60 mL/min (ref >60)
GLUCOSE: 80 mg/dL (ref 65–99)
Potassium: 3.4 mmol/L — ABNORMAL LOW (ref 3.5–5.1)
Sodium: 134 mmol/L — ABNORMAL LOW (ref 135–145)
TOTAL PROTEIN: 7.4 g/dL (ref 6.5–8.1)
Total Bilirubin: 0.6 mg/dL (ref 0.3–1.2)

## 2015-01-26 LAB — CBC WITH DIFFERENTIAL/PLATELET
BASOS ABS: 0 10*3/uL (ref 0.0–0.1)
BASOS PCT: 0 % (ref 0–1)
EOS ABS: 0.1 10*3/uL (ref 0.0–0.7)
EOS PCT: 1 % (ref 0–5)
HCT: 50.3 % (ref 39.0–52.0)
HEMOGLOBIN: 17.6 g/dL — AB (ref 13.0–17.0)
LYMPHS PCT: 21 % (ref 12–46)
Lymphs Abs: 3.1 10*3/uL (ref 0.7–4.0)
MCH: 37.7 pg — AB (ref 26.0–34.0)
MCHC: 35 g/dL (ref 30.0–36.0)
MCV: 107.7 fL — ABNORMAL HIGH (ref 78.0–100.0)
MONO ABS: 1.2 10*3/uL — AB (ref 0.1–1.0)
Monocytes Relative: 8 % (ref 3–12)
Neutro Abs: 10.3 10*3/uL — ABNORMAL HIGH (ref 1.7–7.7)
Neutrophils Relative %: 70 % (ref 43–77)
PLATELETS: 240 10*3/uL (ref 150–400)
RBC: 4.67 MIL/uL (ref 4.22–5.81)
RDW: 15.9 % — AB (ref 11.5–15.5)
WBC: 14.8 10*3/uL — ABNORMAL HIGH (ref 4.0–10.5)

## 2015-01-26 MED ORDER — TRAMADOL HCL 50 MG PO TABS: 50.0000 mg | ORAL_TABLET | Freq: Four times a day (QID) | ORAL | Status: DC | PRN

## 2015-01-26 NOTE — Discharge Instructions (Signed)

## 2015-01-26 NOTE — ED Notes (Signed)
Pt c/o numbness to bilateral fingers x 2 weeks, denies pain or numbness to arms.

## 2015-01-26 NOTE — ED Provider Notes (Signed)
CSN: [id number]     Arrival date & time 01/26/15  1119 History   First MD Initiated Contact with Patient 01/26/15 1157     Chief Complaint  Patient presents with  . Numbness     (Consider location/radiation/quality/duration/timing/severity/associated sxs/prior Treatment) HPI Comments: Patient presents to the ER for evaluation of numbness and tingling in the fingers bilaterally. Symptoms began 2 weeks ago. He reports that the symptoms began in the tips of the fingers, now for fingers on each hand are numb and tingling. He feels like they're swollen. He denies injury. He does not use any vibratory machines such as Landscape architect. Symptoms do not go beyond the fingers. No arm numbness, tingling or weakness. No lower extremity involvement. He does not have headache, blurred vision, chest pain, shortness of breath.   Past Medical History  Diagnosis Date  . High cholesterol   . Hypertension   . Schizophrenia, schizo-affective    Past Surgical History  Procedure Laterality Date  . Left heart catheterization with coronary angiogram N/A 03/12/2014    Procedure: LEFT HEART CATHETERIZATION WITH CORONARY ANGIOGRAM;  Surgeon: Micheline Chapman, MD;  Location: [facility] CATH LAB;  Service: Cardiovascular;  Laterality: N/A;   Family History  Problem Relation Age of Onset  . Cancer Father   . Heart attack Maternal Grandmother   . Heart disease Maternal Grandmother   . Heart attack Maternal Grandfather   . Heart disease Maternal Grandfather   . Heart attack Paternal Grandfather   . Heart disease Paternal Grandfather    History  Substance Use Topics  . Smoking status: Current Every Day Smoker -- 1.00 packs/day for 4 years    Types: Cigarettes  . Smokeless tobacco: Not on file  . Alcohol Use: No    Review of Systems  Neurological: Positive for numbness.  All other systems reviewed and are negative.     Allergies  Review of patient's allergies indicates no known allergies.  Home Medications    Prior to Admission medications   Medication Sig Start Date End Date Taking? Authorizing Provider  aspirin 81 MG chewable tablet Chew 81 mg by mouth daily.   Yes Historical Provider, MD  atenolol-chlorthalidone (TENORETIC) 50-25 MG per tablet Take 1 tablet by mouth daily. 11/25/14  Yes Historical Provider, MD  Cholecalciferol (VITAMIN D PO) Take 1 capsule by mouth 2 (two) times daily.   Yes Historical Provider, MD  CRESTOR 40 MG tablet Take 1 tablet by mouth at bedtime. 11/26/14  Yes Historical Provider, MD  docusate sodium (COLACE) 100 MG capsule Take 100 mg by mouth daily.   Yes Historical Provider, MD  fenofibrate 160 MG tablet Take 160 mg by mouth daily.  11/25/14  Yes Historical Provider, MD  gabapentin (NEURONTIN) 600 MG tablet Take 600 mg by mouth 3 (three) times daily.   Yes Historical Provider, MD  haloperidol decanoate (HALDOL DECANOATE) 100 MG/ML injection Inject 100 mg into the muscle every 28 (twenty-eight) days.  11/24/14  Yes Historical Provider, MD  Magnesium 250 MG TABS Take 250 mg by mouth daily.    Yes Historical Provider, MD  naproxen (NAPROSYN) 500 MG tablet Take 500 mg by mouth 2 (two) times daily with a meal.   Yes Historical Provider, MD  nitroGLYCERIN (NITROSTAT) 0.4 MG SL tablet Place 1 tablet (0.4 mg total) under the tongue every 5 (five) minutes as needed for chest pain. 03/12/14  Yes Ripudeep Jenna Luo, MD  pantoprazole (PROTONIX) 40 MG tablet Take 1 tablet (40 mg total) by mouth  daily. 03/12/14  Yes Ripudeep Jenna Luo, MD  sertraline (ZOLOFT) 50 MG tablet Take 1 tablet by mouth daily. 01/19/15  Yes Historical Provider, MD  tiotropium (SPIRIVA) 18 MCG inhalation capsule Place 18 mcg into inhaler and inhale daily.   Yes Historical Provider, MD  trihexyphenidyl (ARTANE) 2 MG tablet Take 1 tablet (2 mg total) by mouth 3 (three) times daily with meals. 12/02/14  Yes Earney Navy, NP  atenolol (TENORMIN) 50 MG tablet Take 1 tablet (50 mg total) by mouth daily. Patient not taking:  Reported on 01/26/2015 12/13/14   Clydia Llano, MD   BP 156/88 mmHg  Pulse 100  Temp(Src) 98.8 F (37.1 C) (Oral)  Resp 18  SpO2 95% Physical Exam  Constitutional: He is oriented to person, place, and time. He appears well-developed and well-nourished. No distress.  HENT:  Head: Normocephalic and atraumatic.  Right Ear: Hearing normal.  Left Ear: Hearing normal.  Nose: Nose normal.  Mouth/Throat: Oropharynx is clear and moist and mucous membranes are normal.  Eyes: Conjunctivae and EOM are normal. Pupils are equal, round, and reactive to light.  Neck: Normal range of motion. Neck supple.  Cardiovascular: Regular rhythm, S1 normal and S2 normal.  Exam reveals no gallop and no friction rub.   No murmur heard. Pulmonary/Chest: Effort normal and breath sounds normal. No respiratory distress. He exhibits no tenderness.  Abdominal: Soft. Normal appearance and bowel sounds are normal. There is no hepatosplenomegaly. There is no tenderness. There is no rebound, no guarding, no tenderness at McBurney's point and negative Murphy's sign. No hernia.  Musculoskeletal: Normal range of motion.  Neurological: He is alert and oriented to person, place, and time. He has normal strength. No cranial nerve deficit or sensory deficit. Coordination normal. GCS eye subscore is 4. GCS verbal subscore is 5. GCS motor subscore is 6.  Skin: Skin is warm, dry and intact. No rash noted. No cyanosis.  Psychiatric: He has a normal mood and affect. His speech is normal and behavior is normal. Thought content normal.  Nursing note and vitals reviewed.   ED Course  Procedures (including critical care time) Labs Review Labs Reviewed  CBC WITH DIFFERENTIAL/PLATELET - Abnormal; Notable for the following:    WBC 14.8 (*)    Hemoglobin 17.6 (*)    MCV 107.7 (*)    MCH 37.7 (*)    RDW 15.9 (*)    Neutro Abs 10.3 (*)    Monocytes Absolute 1.2 (*)    All other components within normal limits  COMPREHENSIVE METABOLIC  PANEL - Abnormal; Notable for the following:    Sodium 134 (*)    Potassium 3.4 (*)    Chloride 99 (*)    ALT 12 (*)    All other components within normal limits    Imaging Review No results found.   EKG Interpretation None      MDM   Final diagnoses:  None   peripheral neuropathy  Presents to the emergency department for evaluation of numbness, tingling and pain in his fingers. Fingers of both hands are equally involved. No unilateral symptoms. He does not have any observable deficit on examination. He reports pain and tenderness with moving the fingers and touching the fingers, but there are no overlying skin changes. Normal capillary refill. Lab work is unremarkable. He is already taking Neurontin. Etiology of symptoms is unclear at this time. Will need to follow-up with his primary doctor and possibly neurology.    Gilda Crease, MD 01/26/15 1331

## 2015-02-19 ENCOUNTER — Ambulatory Visit (INDEPENDENT_AMBULATORY_CARE_PROVIDER_SITE_OTHER): Payer: Medicare Other | Admitting: Diagnostic Neuroimaging

## 2015-02-19 ENCOUNTER — Encounter (INDEPENDENT_AMBULATORY_CARE_PROVIDER_SITE_OTHER): Payer: Self-pay | Admitting: Diagnostic Neuroimaging

## 2015-02-19 DIAGNOSIS — M79672 Pain in left foot: Secondary | ICD-10-CM

## 2015-02-19 DIAGNOSIS — M79671 Pain in right foot: Secondary | ICD-10-CM | POA: Diagnosis not present

## 2015-02-19 DIAGNOSIS — R202 Paresthesia of skin: Secondary | ICD-10-CM

## 2015-02-19 DIAGNOSIS — Z0289 Encounter for other administrative examinations: Secondary | ICD-10-CM

## 2015-02-19 DIAGNOSIS — M25579 Pain in unspecified ankle and joints of unspecified foot: Secondary | ICD-10-CM

## 2015-02-20 NOTE — Procedures (Signed)
   [facility]  NCS (NERVE CONDUCTION STUDY) WITH EMG (ELECTROMYOGRAPHY) REPORT   STUDY DATE: 02/19/15 PATIENT NAME: [name] FerrisBobby [name] DOB: April 05, 1966 MRN: [id number]  ORDERING CLINICIAN: Ludger NuttingN'Tuma Jah, DPM  TECHNOLOGIST: Gearldine ShownLorraine Jones  ELECTROMYOGRAPHER: Glenford BayleyVikram R. Teegan Guinther, MD  CLINICAL INFORMATION: 49 year old male with lower extremity burning pain and numbness since 2014, and hand pain/numbness since May 2016.   FINDINGS: NERVE CONDUCTION STUDY: Right median and right ulnar motor responses and F wave latencies are normal. Bilateral peroneal and left tibial motor responses have decreased amplitudes and normal conduction velocities. Right tibial motor response could not be obtained. Bilateral H reflex responses could not be obtained.  Right median and right ulnar sensory responses are normal. Bilateral peroneal sensory responses could not be obtained.   NEEDLE ELECTROMYOGRAPHY: Needle examination of right upper extremity deltoid, biceps, triceps, flexor carpi radialis, first dorsal interosseous is normal except for slightly decreased motor unit recruitment in right triceps muscle. No abnormal spontaneous activity of right upper extremity.  Needle examination of right lower extremity gluteus medius, vastus medialis is normal. Right tibialis anterior, tibialis posterior, gastrocnemius muscles have 1-2+ positive sharp waves and fibrillation potentials at rest and decreased motor unit recruitment on exertion. Right L4-5 and L5-S1 paraspinal muscles are normal.  IMPRESSION:  Abnormal study demonstrating: 1. 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 2. No abnormalities of right upper extremity.      INTERPRETING PHYSICIAN:  Suanne MarkerVIKRAM R. Miracle Mongillo, MD Certified in Neurology, Neurophysiology and Neuroimaging  [facility] [address], [address] [place], [place] [address] [phone]

## 2015-03-23 ENCOUNTER — Encounter (HOSPITAL_COMMUNITY): Payer: Self-pay

## 2015-03-23 ENCOUNTER — Emergency Department (HOSPITAL_COMMUNITY)
Admission: EM | Admit: 2015-03-23 | Discharge: 2015-03-23 | Discharge status: Against Medical Advice | Payer: Medicare Other | Attending: Emergency Medicine | Admitting: Emergency Medicine

## 2015-03-23 DIAGNOSIS — I10 Essential (primary) hypertension: Secondary | ICD-10-CM | POA: Diagnosis not present

## 2015-03-23 DIAGNOSIS — Z720 Tobacco use: Secondary | ICD-10-CM | POA: Insufficient documentation

## 2015-03-23 DIAGNOSIS — R1032 Left lower quadrant pain: Secondary | ICD-10-CM | POA: Insufficient documentation

## 2015-03-23 LAB — LIPASE, BLOOD: LIPASE: 23 U/L (ref 22–51)

## 2015-03-23 LAB — CBC
HCT: 47.6 % (ref 39.0–52.0)
Hemoglobin: 17.2 g/dL — ABNORMAL HIGH (ref 13.0–17.0)
MCH: 37.9 pg — AB (ref 26.0–34.0)
MCHC: 36.1 g/dL — ABNORMAL HIGH (ref 30.0–36.0)
MCV: 104.8 fL — AB (ref 78.0–100.0)
Platelets: 222 10*3/uL (ref 150–400)
RBC: 4.54 MIL/uL (ref 4.22–5.81)
RDW: 13.9 % (ref 11.5–15.5)
WBC: 11.7 10*3/uL — AB (ref 4.0–10.5)

## 2015-03-23 LAB — COMPREHENSIVE METABOLIC PANEL
ALT: 20 U/L (ref 17–63)
AST: 23 U/L (ref 15–41)
Albumin: 4 g/dL (ref 3.5–5.0)
Alkaline Phosphatase: 79 U/L (ref 38–126)
Anion gap: 11 (ref 5–15)
BILIRUBIN TOTAL: 0.3 mg/dL (ref 0.3–1.2)
BUN: 10 mg/dL (ref 6–20)
CHLORIDE: 98 mmol/L — AB (ref 101–111)
CO2: 27 mmol/L (ref 22–32)
Calcium: 10.5 mg/dL — ABNORMAL HIGH (ref 8.9–10.3)
Creatinine, Ser: 0.84 mg/dL (ref 0.61–1.24)
GFR calc Af Amer: >60 mL/min (ref >60)
Glucose, Bld: 138 mg/dL — ABNORMAL HIGH (ref 65–99)
Potassium: 3.2 mmol/L — ABNORMAL LOW (ref 3.5–5.1)
Sodium: 136 mmol/L (ref 135–145)
TOTAL PROTEIN: 7.2 g/dL (ref 6.5–8.1)

## 2015-03-23 NOTE — ED Notes (Signed)
Patient c/o left lower abdominal pain x 3 days and has gotten progressively worse. Patient denies any N/V/D or fever.

## 2015-03-23 NOTE — ED Notes (Signed)
Pt stated that he is leaving because of the wait time.

## 2015-03-25 ENCOUNTER — Ambulatory Visit (INDEPENDENT_AMBULATORY_CARE_PROVIDER_SITE_OTHER): Payer: Medicare Other | Admitting: Diagnostic Neuroimaging

## 2015-03-25 ENCOUNTER — Encounter: Payer: Self-pay | Admitting: Diagnostic Neuroimaging

## 2015-03-25 VITALS — BP 144/92 | HR 102 | Ht 68.0 in | Wt 191.2 lb

## 2015-03-25 DIAGNOSIS — R739 Hyperglycemia, unspecified: Secondary | ICD-10-CM

## 2015-03-25 DIAGNOSIS — G609 Hereditary and idiopathic neuropathy, unspecified: Secondary | ICD-10-CM | POA: Diagnosis not present

## 2015-03-25 NOTE — Progress Notes (Signed)
[facility]  PATIENT: [name] [name] DOB: [birth year]  REFERRING CLINICIAN: N'Tuma Jah HISTORY FROM: patient  REASON FOR VISIT: new consult    HISTORICAL  CHIEF COMPLAINT:  Chief Complaint  Patient presents with  . Pain    rm 6, wife- [name] [name], [place] patient, "pain in feet, hands"    HISTORY OF PRESENT ILLNESS:   49 year old right-handed male with hypercholesterolemia, schizoaffective disorder, hypertension, coronary artery disease, here for evaluation of numbness in hands and feet. One half years ago patient had onset of burning stabbing pain in his toes and feet. This progressed up his legs and now in to his hands. Patient had EMG nerve conduction study which showed severe axonal polyneuropathy.  Patient denies any focal weakness. He does have neck and low back pain. Patient previously had severe alcohol abuse up until the year 2000. Previously patient was picking 12-24 beers per day for 8-9 years.   REVIEW OF SYSTEMS: Full 14 system review of systems performed and notable only for joint pain.  ALLERGIES: No Known Allergies  HOME MEDICATIONS: Outpatient Prescriptions Prior to Visit  Medication Sig Dispense Refill  . aspirin 81 MG chewable tablet Chew 81 mg by mouth daily.    Marland Kitchen atenolol (TENORMIN) 50 MG tablet Take 1 tablet (50 mg total) by mouth daily. 30 tablet 0  . Cholecalciferol (VITAMIN D PO) Take 1 capsule by mouth 2 (two) times daily.    . CRESTOR 40 MG tablet Take 1 tablet by mouth at bedtime.    . docusate sodium (COLACE) 100 MG capsule Take 100 mg by mouth daily.    . fenofibrate 160 MG tablet Take 160 mg by mouth daily.     . haloperidol decanoate (HALDOL DECANOATE) 100 MG/ML injection Inject 100 mg into the muscle every 28 (twenty-eight) days.     . nitroGLYCERIN (NITROSTAT) 0.4 MG SL tablet Place 1 tablet (0.4 mg total) under the tongue every 5 (five) minutes as needed for chest pain. 30 tablet 12  . tiotropium (SPIRIVA) 18 MCG inhalation  capsule Place 18 mcg into inhaler and inhale daily.    Marland Kitchen atenolol-chlorthalidone (TENORETIC) 50-25 MG per tablet Take 1 tablet by mouth daily.    Marland Kitchen gabapentin (NEURONTIN) 600 MG tablet Take 600 mg by mouth 3 (three) times daily.    . Magnesium 250 MG TABS Take 250 mg by mouth daily.     . naproxen (NAPROSYN) 500 MG tablet Take 500 mg by mouth 2 (two) times daily with a meal.    . pantoprazole (PROTONIX) 40 MG tablet Take 1 tablet (40 mg total) by mouth daily. 30 tablet 4  . sertraline (ZOLOFT) 50 MG tablet Take 1 tablet by mouth daily.    . traMADol (ULTRAM) 50 MG tablet Take 1 tablet (50 mg total) by mouth every 6 (six) hours as needed. 15 tablet 0  . trihexyphenidyl (ARTANE) 2 MG tablet Take 1 tablet (2 mg total) by mouth 3 (three) times daily with meals. 90 tablet 0   No facility-administered medications prior to visit.    PAST MEDICAL HISTORY: Past Medical History  Diagnosis Date  . High cholesterol   . Hypertension   . Schizophrenia, schizo-affective     PAST SURGICAL HISTORY: Past Surgical History  Procedure Laterality Date  . Left heart catheterization with coronary angiogram N/A 03/12/2014    Procedure: LEFT HEART CATHETERIZATION WITH CORONARY ANGIOGRAM;  Surgeon: Micheline Chapman, MD;  Location: [facility] CATH LAB;  Service: Cardiovascular;  Laterality: N/A;  FAMILY HISTORY: Family History  Problem Relation Age of Onset  . Cancer Father   . Heart attack Maternal Grandmother   . Heart disease Maternal Grandmother   . Heart attack Maternal Grandfather   . Heart disease Maternal Grandfather   . Heart attack Paternal Grandfather   . Heart disease Paternal Grandfather     SOCIAL HISTORY:  Social History   Social History  . Marital Status: Married    Spouse Name: [name] [name]  . Number of Children: 3  . Years of Education: 8   Occupational History  . unemployed    Social History Main Topics  . Smoking status: Current Every Day Smoker -- 1.00 packs/day for 4 years     Types: Cigarettes  . Smokeless tobacco: Never Used  . Alcohol Use: No  . Drug Use: No  . Sexual Activity: Not on file   Other Topics Concern  . Not on file   Social History Narrative   Lives with wife at home   Caffeine use- coffee 3 cups, sodas 4-5 daily     PHYSICAL EXAM  GENERAL EXAM/CONSTITUTIONAL: Vitals:  Filed Vitals:   03/25/15 1141  BP: 144/92  Pulse: 102  Height: 5\' 8"  (1.727 m)  Weight: 191 lb 3.2 oz (86.728 kg)     Body mass index is 29.08 kg/(m^2).  Visual Acuity Screening   Right eye Left eye Both eyes  Without correction:     With correction: 20/70 20/100      Patient is in no distress; well developed, nourished; neck is supple  SMELLS OF CIG SMOKE; DISHEVELED  MASKED FACIES  EDENTULOUS  MILD ORAL DYSKINESIAS   CARDIOVASCULAR:  Examination of carotid arteries is normal; no carotid bruits  Regular rate and rhythm, no murmurs  Examination of peripheral vascular system by observation and palpation is normal  EYES:  Ophthalmoscopic exam of optic discs and posterior segments is normal; no papilledema or hemorrhages  MUSCULOSKELETAL:  Gait, strength, tone, movements noted in Neurologic exam below  NEUROLOGIC: MENTAL STATUS:  No flowsheet data found.  awake, alert, oriented to person, place and time  recent and remote memory intact  normal attention and concentration  language fluent, comprehension intact, naming intact,   fund of knowledge appropriate  CRANIAL NERVE:   2nd - no papilledema on fundoscopic exam  2nd, 3rd, 4th, 6th - pupils equal and reactive to light, visual fields full to confrontation, extraocular muscles intact, no nystagmus  5th - facial sensation symmetric  7th - facial strength symmetric  8th - hearing intact  9th - palate elevates symmetrically, uvula midline  11th - shoulder shrug symmetric  12th - tongue protrusion midline  MOTOR:   normal bulk and tone, full strength in the BUE,  BLE  SENSORY:   normal and symmetric to light touch, temperature, vibration; DECR PP IN BLE  COORDINATION:   finger-nose-finger, fine finger movements normal  REFLEXES:   deep tendon reflexes TRACE and symmetric  GAIT/STATION:   narrow based gait; DIFF WITH HEEL WALKING; TANDEM UNSTEADY; romberg is negative    DIAGNOSTIC DATA (LABS, IMAGING, TESTING) - I reviewed patient records, labs, notes, testing and imaging myself where available.  Lab Results  Component Value Date   WBC 11.7* 03/23/2015   HGB 17.2* 03/23/2015   HCT 47.6 03/23/2015   MCV 104.8* 03/23/2015   PLT 222 03/23/2015      Component Value Date/Time   NA 136 03/23/2015 1511   K 3.2* 03/23/2015 1511   CL 98* 03/23/2015  1511   CO2 27 03/23/2015 1511   GLUCOSE 138* 03/23/2015 1511   BUN 10 03/23/2015 1511   CREATININE 0.84 03/23/2015 1511   CALCIUM 10.5* 03/23/2015 1511   PROT 7.2 03/23/2015 1511   ALBUMIN 4.0 03/23/2015 1511   AST 23 03/23/2015 1511   ALT 20 03/23/2015 1511   ALKPHOS 79 03/23/2015 1511   BILITOT 0.3 03/23/2015 1511   GFRNONAA >60 03/23/2015 1511   GFRAA >60 03/23/2015 1511   Lab Results  Component Value Date   CHOL 299* 04/07/2014   HDL 30* 04/07/2014   LDLCALC NOT CALC 04/07/2014   TRIG 1220* 04/07/2014   CHOLHDL 10.0 04/07/2014   Lab Results  Component Value Date   HGBA1C 6.4* 12/11/2014   Lab Results  Component Value Date   VITAMINB12 775 03/10/2014   Lab Results  Component Value Date   TSH 0.833 12/11/2014    02/20/15 EMG/NCS  1. 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 2. No abnormalities of right upper extremity.    ASSESSMENT AND PLAN  49 y.o. year old male here with progressive burning neuropathic pain in hands and feet. Exam and electrical testing consistent with severe axonal polyneuropathy. Could be related to prior alcohol abuse versus hyperglycemia/diabetes.  Ddx nerve/burning pain in  feet and hands: diabetic neuropathy vs alcoholic neuropathy vs polyradiculopathy  PLAN: - repeat A1c - ask PCP or psychiatry to consider gabapentin, cymbalta or lyrica for pain control - in future, could consider MRI lumbar spine evaluation  Orders Placed This Encounter  Procedures  . Hemoglobin A1c   Return if symptoms worsen or fail to improve, for return to PCP.    Suanne Marker, MD 03/25/2015, 12:20 PM Certified in Neurology, Neurophysiology and Neuroimaging  [facility] [address], [address] [place], [place] [address] [phone]

## 2015-03-25 NOTE — Patient Instructions (Signed)
I will check lab testing today.  Follow up with PCP Neville Route) and psychiatry to discuss possibly using gabapentin, lyrica, cymbalta for pain/neuropathy/mood control.

## 2015-03-26 ENCOUNTER — Encounter: Payer: Self-pay | Admitting: Diagnostic Neuroimaging

## 2015-03-26 LAB — HEMOGLOBIN A1C
Est. average glucose Bld gHb Est-mCnc: 143 mg/dL
HEMOGLOBIN A1C: 6.6 % — AB (ref 4.8–5.6)

## 2015-03-30 NOTE — Progress Notes (Signed)
Per Dr Marjory Lies, spoke with patient on phone and informed him his A1C lab is higher than the previous result. Informed him that Dr Marjory Lies wants him to see his PCP for control of his diabetes. Informed him this RN will fax his A1C results to Dr Laurence Aly today. Confirmed that Dr Hyacinth Meeker is still his PCP. Patient verbalized understanding, agreement.

## 2016-04-20 IMAGING — CR DG CHEST 2V
2 series · 2 of 2 positions shown · non-contrast
Comparison: October 29, 2011

CLINICAL DATA: Chest pain and fatigue

EXAM:
CHEST  2 VIEW

[w chest pa]
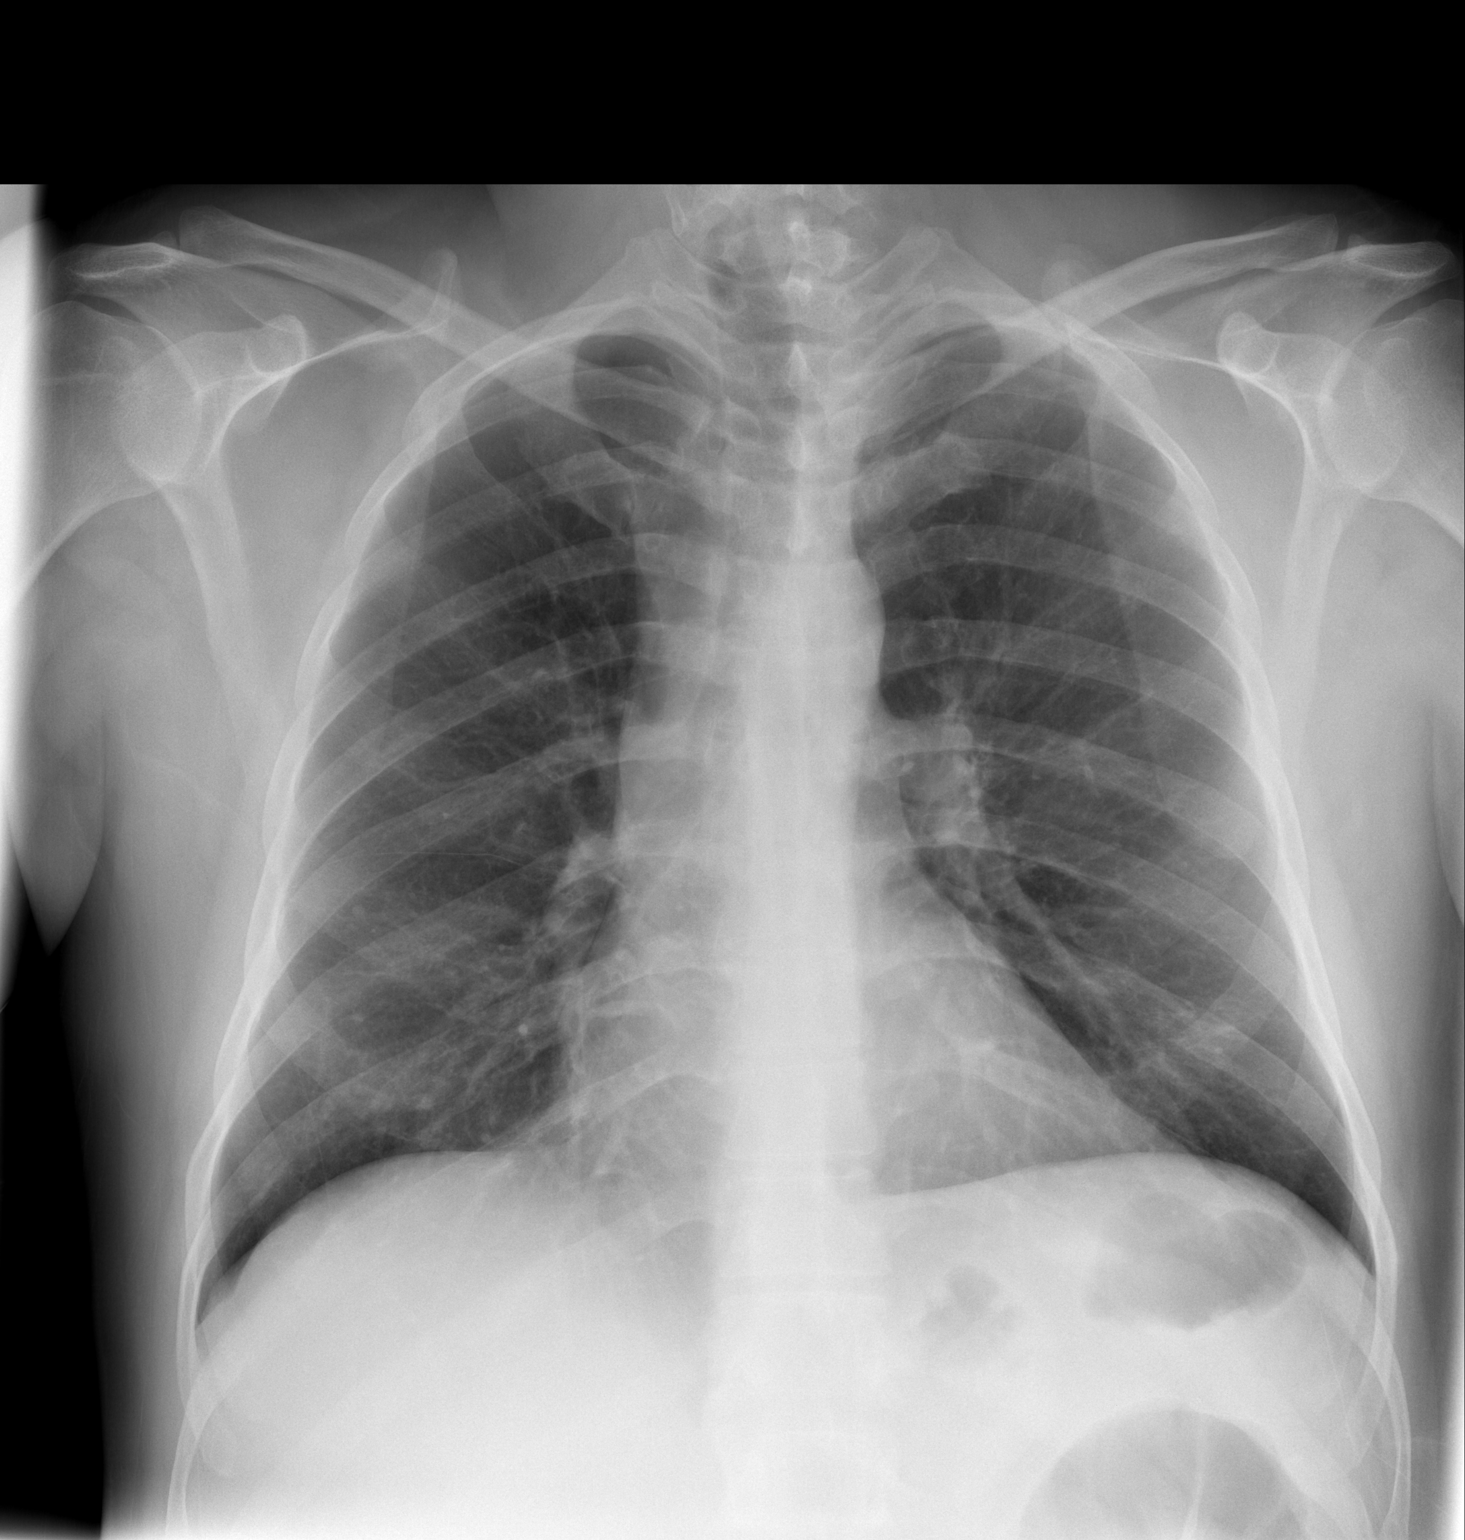

[w chest lat]
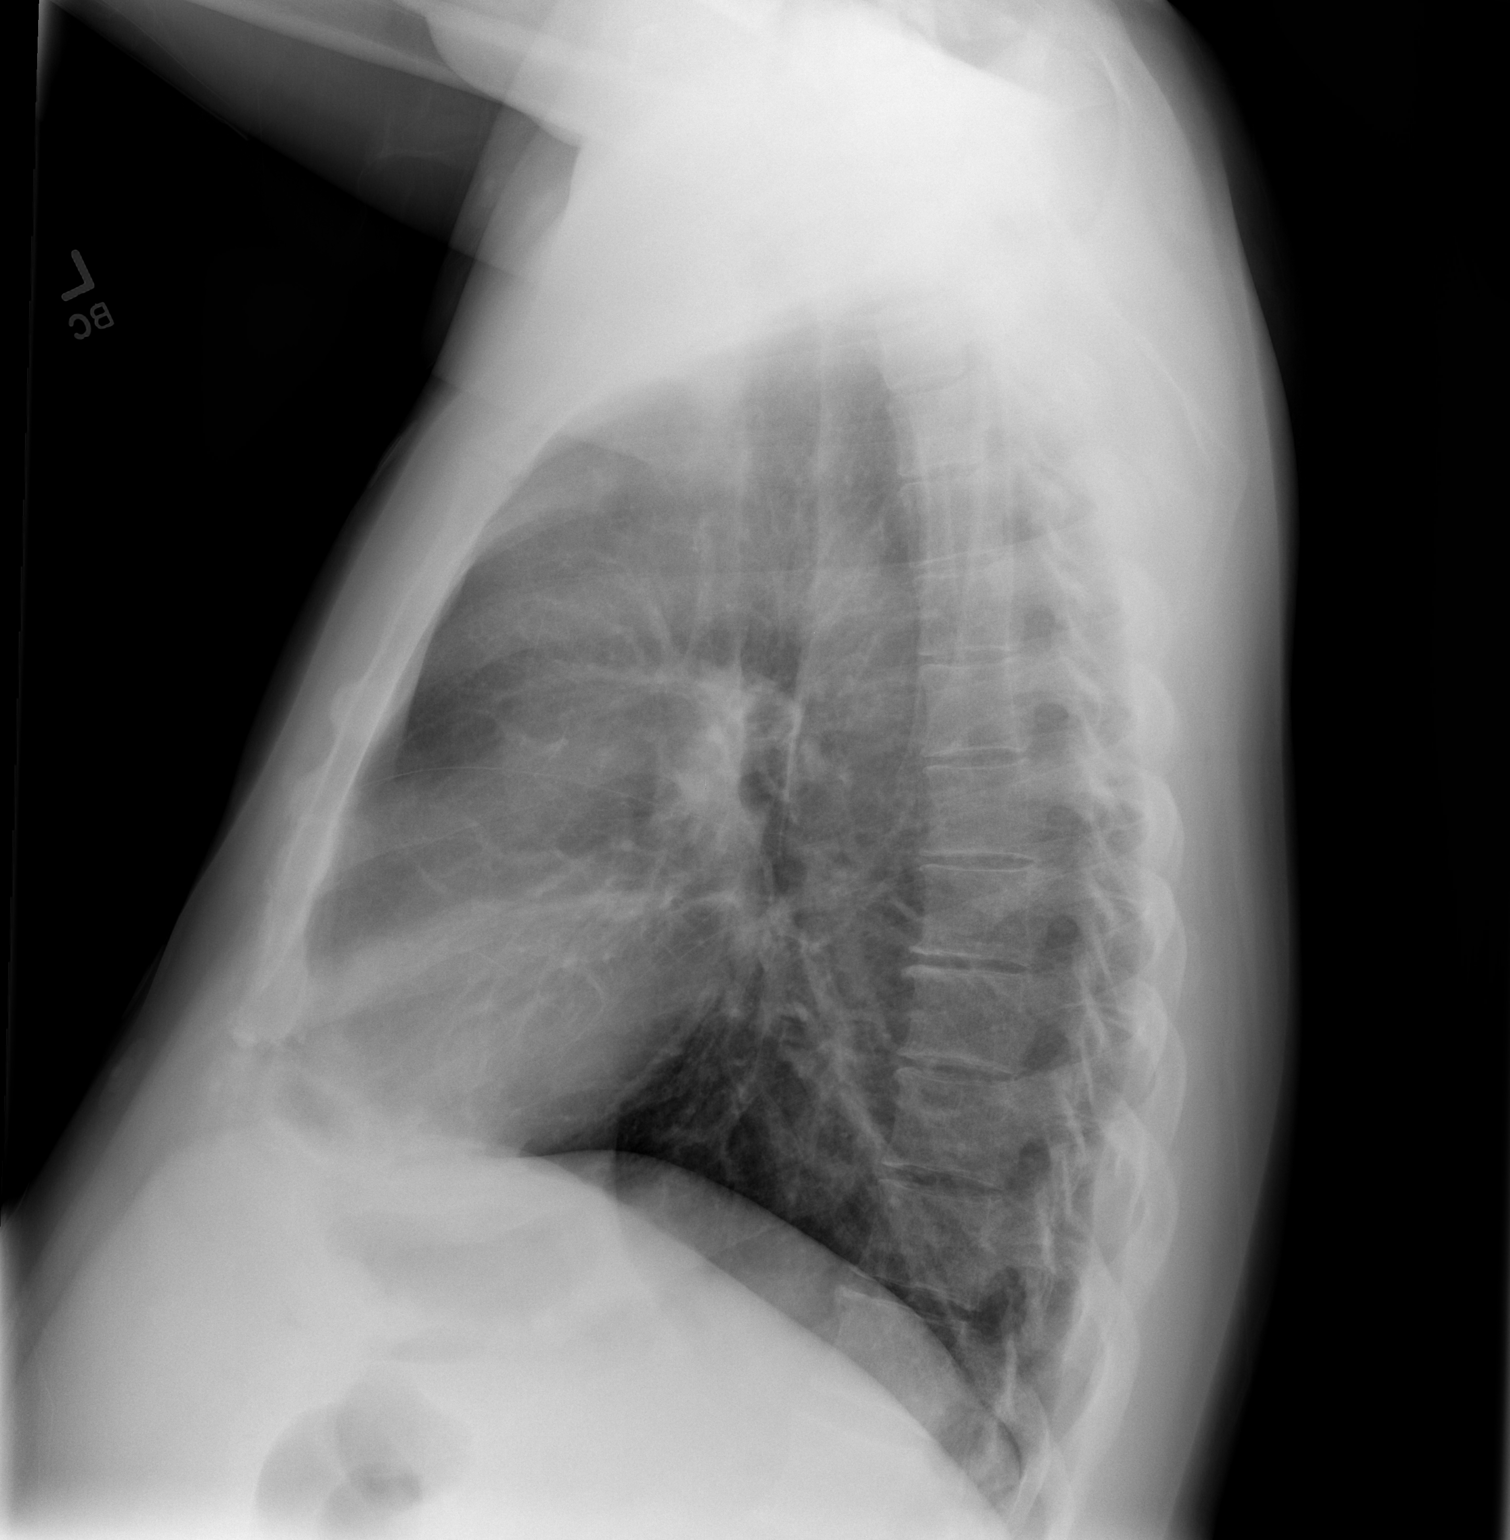

[2 of 2 positions shown; findings below may reference images not displayed]

FINDINGS: There is no edema or consolidation. Heart size and pulmonary
vascularity are within normal limits. No adenopathy. No
pneumothorax. No bone lesions.
IMPRESSION: No edema or consolidation.

## 2022-10-17 ENCOUNTER — Other Ambulatory Visit: Payer: Self-pay

## 2022-10-17 ENCOUNTER — Emergency Department (HOSPITAL_COMMUNITY)
Admission: EM | Admit: 2022-10-17 | Discharge: 2022-10-17 | Disposition: A | Discharge status: Home / Self Care | Payer: 59 | Attending: Emergency Medicine | Admitting: Emergency Medicine

## 2022-10-17 ENCOUNTER — Emergency Department (HOSPITAL_COMMUNITY): Payer: 59

## 2022-10-17 DIAGNOSIS — W208XXA Other cause of strike by thrown, projected or falling object, initial encounter: Secondary | ICD-10-CM | POA: Insufficient documentation

## 2022-10-17 DIAGNOSIS — Z23 Encounter for immunization: Secondary | ICD-10-CM | POA: Insufficient documentation

## 2022-10-17 DIAGNOSIS — R2689 Other abnormalities of gait and mobility: Secondary | ICD-10-CM | POA: Insufficient documentation

## 2022-10-17 DIAGNOSIS — E1140 Type 2 diabetes mellitus with diabetic neuropathy, unspecified: Secondary | ICD-10-CM | POA: Insufficient documentation

## 2022-10-17 DIAGNOSIS — Z79899 Other long term (current) drug therapy: Secondary | ICD-10-CM | POA: Diagnosis not present

## 2022-10-17 DIAGNOSIS — R262 Difficulty in walking, not elsewhere classified: Secondary | ICD-10-CM

## 2022-10-17 DIAGNOSIS — X31XXXA Exposure to excessive natural cold, initial encounter: Secondary | ICD-10-CM | POA: Diagnosis not present

## 2022-10-17 DIAGNOSIS — W19XXXA Unspecified fall, initial encounter: Secondary | ICD-10-CM

## 2022-10-17 DIAGNOSIS — R251 Tremor, unspecified: Secondary | ICD-10-CM | POA: Diagnosis not present

## 2022-10-17 DIAGNOSIS — S60519A Abrasion of unspecified hand, initial encounter: Secondary | ICD-10-CM | POA: Insufficient documentation

## 2022-10-17 DIAGNOSIS — E1165 Type 2 diabetes mellitus with hyperglycemia: Secondary | ICD-10-CM | POA: Insufficient documentation

## 2022-10-17 DIAGNOSIS — Z7982 Long term (current) use of aspirin: Secondary | ICD-10-CM | POA: Insufficient documentation

## 2022-10-17 DIAGNOSIS — Y92512 Supermarket, store or market as the place of occurrence of the external cause: Secondary | ICD-10-CM | POA: Diagnosis not present

## 2022-10-17 DIAGNOSIS — T68XXXA Hypothermia, initial encounter: Secondary | ICD-10-CM | POA: Diagnosis not present

## 2022-10-17 DIAGNOSIS — I10 Essential (primary) hypertension: Secondary | ICD-10-CM | POA: Diagnosis not present

## 2022-10-17 LAB — CBC WITH DIFFERENTIAL/PLATELET
Abs Immature Granulocytes: 0.06 10*3/uL (ref 0.00–0.07)
Basophils Absolute: 0 10*3/uL (ref 0.0–0.1)
Basophils Relative: 0 %
Eosinophils Absolute: 0.2 10*3/uL (ref 0.0–0.5)
Eosinophils Relative: 1 %
HCT: 38.6 % — ABNORMAL LOW (ref 39.0–52.0)
Hemoglobin: 13.4 g/dL (ref 13.0–17.0)
Immature Granulocytes: 1 %
Lymphocytes Relative: 18 %
Lymphs Abs: 2 10*3/uL (ref 0.7–4.0)
MCH: 37.2 pg — ABNORMAL HIGH (ref 26.0–34.0)
MCHC: 34.7 g/dL (ref 30.0–36.0)
MCV: 107.2 fL — ABNORMAL HIGH (ref 80.0–100.0)
Monocytes Absolute: 1.3 10*3/uL — ABNORMAL HIGH (ref 0.1–1.0)
Monocytes Relative: 12 %
Neutro Abs: 7.6 10*3/uL (ref 1.7–7.7)
Neutrophils Relative %: 68 %
Platelets: 281 10*3/uL (ref 150–400)
RBC: 3.6 MIL/uL — ABNORMAL LOW (ref 4.22–5.81)
RDW: 14.2 % (ref 11.5–15.5)
WBC: 11.1 10*3/uL — ABNORMAL HIGH (ref 4.0–10.5)
nRBC: 0 % (ref 0.0–0.2)

## 2022-10-17 LAB — URINALYSIS, ROUTINE W REFLEX MICROSCOPIC
Bacteria, UA: NONE SEEN
Bilirubin Urine: NEGATIVE
Glucose, UA: NEGATIVE mg/dL
Hgb urine dipstick: NEGATIVE
Ketones, ur: NEGATIVE mg/dL
Leukocytes,Ua: NEGATIVE
Nitrite: NEGATIVE
Protein, ur: 100 mg/dL — AB
Specific Gravity, Urine: 1.017 (ref 1.005–1.030)
pH: 5 (ref 5.0–8.0)

## 2022-10-17 LAB — COMPREHENSIVE METABOLIC PANEL
ALT: 23 U/L (ref 0–44)
AST: 29 U/L (ref 15–41)
Albumin: 3.5 g/dL (ref 3.5–5.0)
Alkaline Phosphatase: 103 U/L (ref 38–126)
Anion gap: 9 (ref 5–15)
BUN: 14 mg/dL (ref 6–20)
CO2: 22 mmol/L (ref 22–32)
Calcium: 8.8 mg/dL — ABNORMAL LOW (ref 8.9–10.3)
Chloride: 102 mmol/L (ref 98–111)
Creatinine, Ser: 0.79 mg/dL (ref 0.61–1.24)
GFR, Estimated: >60 mL/min (ref >60)
Glucose, Bld: 132 mg/dL — ABNORMAL HIGH (ref 70–99)
Potassium: 4.1 mmol/L (ref 3.5–5.1)
Sodium: 133 mmol/L — ABNORMAL LOW (ref 135–145)
Total Bilirubin: 0.9 mg/dL (ref 0.3–1.2)
Total Protein: 6.7 g/dL (ref 6.5–8.1)

## 2022-10-17 LAB — CBG MONITORING, ED: Glucose-Capillary: 143 mg/dL — ABNORMAL HIGH (ref 70–99)

## 2022-10-17 LAB — RAPID URINE DRUG SCREEN, HOSP PERFORMED
Amphetamines: NOT DETECTED
Barbiturates: NOT DETECTED
Benzodiazepines: NOT DETECTED
Cocaine: NOT DETECTED
Opiates: NOT DETECTED
Tetrahydrocannabinol: NOT DETECTED

## 2022-10-17 MED ORDER — SODIUM CHLORIDE 0.9 % IV SOLN: Freq: Once | INTRAVENOUS | Status: AC

## 2022-10-17 MED ORDER — HYDROXYZINE HCL 25 MG PO TABS
25.0000 mg | ORAL_TABLET | Freq: Once | ORAL | Status: AC
  Administered 2022-10-17: 25 mg via ORAL
  Filled 2022-10-17: qty 1

## 2022-10-17 MED ORDER — TETANUS-DIPHTH-ACELL PERTUSSIS 5-2.5-18.5 LF-MCG/0.5 IM SUSY
0.5000 mL | PREFILLED_SYRINGE | Freq: Once | INTRAMUSCULAR | Status: AC
  Administered 2022-10-17: 0.5 mL via INTRAMUSCULAR
  Filled 2022-10-17: qty 0.5

## 2022-10-17 NOTE — ED Notes (Addendum)
Pt ambulated to bathroom using walker. RN assisted pt in getting dressed.  Pt anxious, confused and repeatedly asking "where is debra? Can you get me a cab home" despite RN explaining to pt. Pt frequently coming up to staff repeating those 2 questions. Pt states that he currently lives at [address] which is different than address on file. RN attempted to call [facility] but phone number out of service.  RN does not feel that cab ride home is safe for pt at this time. RN consulted charge nurse for further advice.

## 2022-10-17 NOTE — ED Notes (Signed)
Gave pt food and drink and currently sitting steady  in chair

## 2022-10-17 NOTE — ED Notes (Signed)
[facility] [place] [facility] [place] [facility] come back and he was gone patient left AMA

## 2022-10-17 NOTE — ED Triage Notes (Signed)
Pt was dropped off at [facility] at 1800 last night and he he spent all night outside.  Pt has abrasions on hands from jumping a fence.  Pt also soiled himself d/t using a walker and not very mobile.

## 2022-10-17 NOTE — ED Notes (Signed)
Unable to locate pt at this time. No belongings left behind.
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
## 2022-10-17 NOTE — ED Notes (Signed)
RN spoke with pt sister Lovey Newcomer who says that [facility] is on her way to come pick up pt

## 2022-10-17 NOTE — ED Notes (Signed)
RN obtained taxi voucher for pt per SW request
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
## 2023-09-14 ENCOUNTER — Ambulatory Visit: Payer: Self-pay | Admitting: Family Medicine

## 2023-09-25 ENCOUNTER — Encounter: Payer: Self-pay | Admitting: Family Medicine

## 2023-10-09 ENCOUNTER — Ambulatory Visit (INDEPENDENT_AMBULATORY_CARE_PROVIDER_SITE_OTHER): Payer: 59 | Admitting: Orthopedic Surgery

## 2023-10-09 DIAGNOSIS — Z89432 Acquired absence of left foot: Secondary | ICD-10-CM | POA: Diagnosis not present

## 2023-10-09 DIAGNOSIS — L97521 Non-pressure chronic ulcer of other part of left foot limited to breakdown of skin: Secondary | ICD-10-CM

## 2023-10-10 ENCOUNTER — Encounter: Payer: Self-pay | Admitting: Orthopedic Surgery

## 2023-10-10 NOTE — Progress Notes (Signed)
 Office Visit Note   Patient: [name] [name]           Date of Birth: September 01, 1965           MRN: [id number] Visit Date: 10/09/2023              Requested by: Maye Hides, PA [phone] [place],  [place] [address] PCP: Maye Hides, PA  Chief Complaint  Patient presents with   Left Foot - Pain      HPI: Patient is a 58 year old gentleman is seen for initial evaluation for a Wagner grade 1 ulcer with a left transmetatarsal amputation.  Patient states it has been present for 3 months.  Assessment & Plan: Visit Diagnoses:  1. History of transmetatarsal amputation of left foot (HCC)   2. Non-pressure chronic ulcer of other part of left foot limited to breakdown of skin (HCC)     Plan: Ulcer was debrided begin routine wound care follow-up in 4 weeks.  Follow-Up Instructions: Return in about 4 weeks (around 11/06/2023).   Ortho Exam  Patient is alert, oriented, no adenopathy, well-dressed, normal affect, normal respiratory effort. Examination there is no cellulitis no drainage from the left foot.  Patient has a Wagner grade 1 ulcer.  After informed consent a 10 blade knife was used to debride the skin and soft tissue back to healthy viable granulation tissue.  Ulcer measures 3 cm in diameter after debridement.  Patient also had multiple calluses that were also pared.  Imaging: No results found. No images are attached to the encounter.  Labs: Lab Results  Component Value Date   HGBA1C 6.6 (H) 03/25/2015   HGBA1C 6.4 (H) 12/11/2014   HGBA1C 6.1 (H) 03/10/2014     Lab Results  Component Value Date   ALBUMIN 3.5 10/17/2022   ALBUMIN 4.0 03/23/2015   ALBUMIN 4.1 01/26/2015    Lab Results  Component Value Date   MG 2.2 12/12/2014   MG 1.8 12/11/2014   MG 1.7 12/01/2014   No results found for: "VD25OH"  No results found for: "PREALBUMIN"    Latest Ref Rng & Units 10/17/2022    7:40 AM 03/23/2015    3:11 PM 01/26/2015   12:16 PM  CBC EXTENDED  WBC 4.0 - 10.5  K/uL 11.1  11.7  14.8   RBC 4.22 - 5.81 MIL/uL 3.60  4.54  4.67   Hemoglobin 13.0 - 17.0 g/dL 78.2  95.6  21.3   HCT 39.0 - 52.0 % 38.6  47.6  50.3   Platelets 150 - 400 K/uL 281  222  240   NEUT# 1.7 - 7.7 K/uL 7.6   10.3   Lymph# 0.7 - 4.0 K/uL 2.0   3.1      There is no height or weight on file to calculate BMI.  Orders:  No orders of the defined types were placed in this encounter.  No orders of the defined types were placed in this encounter.    Procedures: No procedures performed  Clinical Data: No additional findings.  ROS:  All other systems negative, except as noted in the HPI. Review of Systems  Objective: Vital Signs: There were no vitals taken for this visit.  Specialty Comments:  No specialty comments available.  PMFS History: Patient Active Problem List   Diagnosis Date Noted   Dizziness 12/11/2014   Hyponatremia 12/11/2014   QT prolongation 12/11/2014   Suicidal ideation    Schizophrenia, unspecified type (HCC) 12/01/2014   Suicidal ideations  12/01/2014   Other malaise and fatigue 04/07/2014   Weight gain 04/07/2014   History of IBS 04/07/2014   Tobacco dependence 04/07/2014   Bilateral foot pain 04/07/2014   Dysphagia 03/10/2014   Tobacco abuse 03/10/2014   Hypokalemia 03/10/2014   Chest pain 03/09/2014   HLD (hyperlipidemia) 03/09/2014   HTN (hypertension) 03/09/2014   Past Medical History:  Diagnosis Date   High cholesterol    Hypertension    Schizophrenia, schizo-affective (HCC)     Family History  Problem Relation Age of Onset   Cancer Father    Heart attack Maternal Grandmother    Heart disease Maternal Grandmother    Heart attack Maternal Grandfather    Heart disease Maternal Grandfather    Heart attack Paternal Grandfather    Heart disease Paternal Grandfather     Past Surgical History:  Procedure Laterality Date   LEFT HEART CATHETERIZATION WITH CORONARY ANGIOGRAM N/A 03/12/2014   Procedure: LEFT HEART CATHETERIZATION  WITH CORONARY ANGIOGRAM;  Surgeon: Micheline Chapman, MD;  Location: [facility] CATH LAB;  Service: Cardiovascular;  Laterality: N/A;   Social History   Occupational History   Occupation: unemployed  Tobacco Use   Smoking status: Every Day    Current packs/day: 1.00    Average packs/day: 1 pack/day for 4.0 years (4.0 ttl pk-yrs)    Types: Cigarettes   Smokeless tobacco: Never  Substance and Sexual Activity   Alcohol use: No   Drug use: No   Sexual activity: Not on file

## 2023-11-06 ENCOUNTER — Ambulatory Visit: Payer: 59 | Admitting: Orthopedic Surgery
